# Patient Record
Sex: Female | Born: 1955
Health system: Southern US, Community
[De-identification: ages and names within clinical notes are randomized; demographics above are authoritative.]

## PROBLEM LIST (undated history)

## (undated) DIAGNOSIS — C4490 Unspecified malignant neoplasm of skin, unspecified: Secondary | ICD-10-CM

## (undated) DIAGNOSIS — G629 Polyneuropathy, unspecified: Secondary | ICD-10-CM

## (undated) DIAGNOSIS — G709 Myoneural disorder, unspecified: Secondary | ICD-10-CM

## (undated) DIAGNOSIS — J45909 Unspecified asthma, uncomplicated: Secondary | ICD-10-CM

## (undated) DIAGNOSIS — H269 Unspecified cataract: Secondary | ICD-10-CM

## (undated) DIAGNOSIS — E079 Disorder of thyroid, unspecified: Secondary | ICD-10-CM

## (undated) DIAGNOSIS — N951 Menopausal and female climacteric states: Secondary | ICD-10-CM

## (undated) DIAGNOSIS — M1990 Unspecified osteoarthritis, unspecified site: Secondary | ICD-10-CM

## (undated) DIAGNOSIS — E039 Hypothyroidism, unspecified: Secondary | ICD-10-CM

## (undated) DIAGNOSIS — G43909 Migraine, unspecified, not intractable, without status migrainosus: Secondary | ICD-10-CM

## (undated) DIAGNOSIS — T7840XA Allergy, unspecified, initial encounter: Secondary | ICD-10-CM

## (undated) HISTORY — DX: Unspecified cataract: H26.9

## (undated) HISTORY — DX: Migraine, unspecified, not intractable, without status migrainosus: G43.909

## (undated) HISTORY — DX: Unspecified asthma, uncomplicated: J45.909

## (undated) HISTORY — DX: Menopausal and female climacteric states: N95.1

## (undated) HISTORY — DX: Unspecified osteoarthritis, unspecified site: M19.90

## (undated) HISTORY — DX: Disorder of thyroid, unspecified: E07.9

## (undated) HISTORY — PX: COLON SURGERY: SHX602

## (undated) HISTORY — PX: APPENDECTOMY: SHX54

## (undated) HISTORY — DX: Polyneuropathy, unspecified: G62.9

## (undated) HISTORY — DX: Myoneural disorder, unspecified: G70.9

## (undated) HISTORY — DX: Allergy, unspecified, initial encounter: T78.40XA

---

## 1978-10-14 DIAGNOSIS — G43909 Migraine, unspecified, not intractable, without status migrainosus: Secondary | ICD-10-CM

## 1978-10-14 HISTORY — DX: Migraine, unspecified, not intractable, without status migrainosus: G43.909

## 2007-05-08 LAB — HM COLONOSCOPY: HM Colonoscopy: NORMAL

## 2011-02-13 ENCOUNTER — Ambulatory Visit: Payer: Self-pay | Admitting: Family Medicine

## 2012-04-20 ENCOUNTER — Ambulatory Visit: Payer: Self-pay | Admitting: Family Medicine

## 2013-02-03 ENCOUNTER — Ambulatory Visit (INDEPENDENT_AMBULATORY_CARE_PROVIDER_SITE_OTHER): Payer: 59 | Admitting: Internal Medicine

## 2013-02-03 ENCOUNTER — Encounter: Payer: Self-pay | Admitting: Internal Medicine

## 2013-02-03 VITALS — BP 108/72 | HR 67 | Temp 98.5°F | Resp 14 | Ht 65.0 in | Wt 137.5 lb

## 2013-02-03 DIAGNOSIS — G629 Polyneuropathy, unspecified: Secondary | ICD-10-CM

## 2013-02-03 DIAGNOSIS — E039 Hypothyroidism, unspecified: Secondary | ICD-10-CM

## 2013-02-03 DIAGNOSIS — G609 Hereditary and idiopathic neuropathy, unspecified: Secondary | ICD-10-CM

## 2013-02-03 DIAGNOSIS — N951 Menopausal and female climacteric states: Secondary | ICD-10-CM

## 2013-02-03 NOTE — Progress Notes (Signed)
Patient ID: Kristina Barnes, female   DOB: [birth year], 57 y.o.   MRN: [id number]     Patient Active Problem List  Diagnosis  . Menopause syndrome  . Sensory neuropathy    Subjective:  CC:   Chief Complaint  Patient presents with  . Establish Care    HPI:   Kristina Barnes is a 57 y.o. female who presents as a new patient to establish primary care with the chief complaint of  Establish care. 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 She has no chief complaints today. She has had recent labs at [facility] from October which she has brought today and are reviewed today normal.  She has a history of postmenopausal syndrome including hot flashes, mood lability, vaginal dryness and dyspareunia. She has been using ginkgo and recently began taking Lexapro which has helped significantly with all symptoms.  She has a history of a Sensory peirpheral neuropathy  which was diagnosed 7 yrs ago,   symptomsStarted in the feet,   and were accompanied by fasciculations.   There has beenNo progression. She has had MRI and  neurologic  eval with Marshell Garfinkel.  she has had a repeat MRI  to follow changes concerning for early MS and changes were deemed to be nonsignificant however she was advised to discontinue oral contraceptives due to recurrent migraines. She has no functional limitations as a result of her neuropathy. Her migraines at this point are very rare, her last one occurring 3 months ago. She describes in his visual migraines which are managed with aspirin. She does have prodrome symptoms which allow her to remove her stones that for many unsafe situations.     Past Medical History  Diagnosis Date  . Allergy   . Thyroid disease   . Migraine y-41  . Menopause syndrome   . Sensory neuropathy     Past Surgical History  Procedure Laterality Date  . Appendectomy      Family History   Problem Relation Age of Onset  . Arthritis Mother   . Hypertension Mother   . Cancer Father   . Depression Sister   . Pulmonary embolism Sister   . Alcohol abuse Sister   . Alcohol abuse Brother     History   Social History  . Marital Status: Married    Spouse Name: N/A    Number of Children: N/A  . Years of Education: N/A   Occupational History  . Not on file.   Social History Main Topics  . Smoking status: Never Smoker   . Smokeless tobacco: Never Used  . Alcohol Use: 3.5 oz/week    7 drink(s) per week     Comment: daily with dinner  . Drug Use: No  . Sexually Active: Yes    Birth Control/ Protection: Post-menopausal   Other Topics Concern  . Not on file   Social History Narrative  . No narrative on file    Allergies  Allergen Reactions  . Penicillins Rash  . Sulfa Antibiotics Rash   Allergies  Allergen Reactions  . Penicillins Rash  . Sulfa Antibiotics Rash     Review of Systems:  Patient denies headache, fevers, malaise, unintentional weight loss, skin rash, eye pain, sinus congestion and sinus pain, sore throat, dysphagia,  hemoptysis , cough, dyspnea, wheezing, chest pain, palpitations, orthopnea, edema, abdominal pain, nausea, melena, diarrhea, constipation, flank pain,  dysuria, hematuria, urinary  Frequency, nocturia, numbness, tingling, seizures,  Focal weakness, Loss of consciousness,  Tremor, insomnia, depression, anxiety, and suicidal ideation.    Objective:  BP 108/72  Pulse 67  Temp(Src) 98.5 F (36.9 C) (Oral)  Resp 14  Ht 5\' 5"  (1.651 m)  Wt 137 lb 8 oz (62.37 kg)  BMI 22.88 kg/m2  SpO2 98%  LMP 08/26/2012  General appearance: alert, cooperative and appears stated age Ears: normal TM's and external ear canals both ears Throat: lips, mucosa, and tongue normal; teeth and gums normal Neck: no adenopathy, no carotid bruit, supple, symmetrical, trachea midline and thyroid not enlarged, symmetric, no tenderness/mass/nodules Back:  symmetric, no curvature. ROM normal. No CVA tenderness. Lungs: clear to auscultation bilaterally Heart: regular rate and rhythm, S1, S2 normal, no murmur, click, rub or gallop Abdomen: soft, non-tender; bowel sounds normal; no masses,  no organomegaly Pulses: 2+ and symmetric Skin: Skin color, texture, turgor normal. No rashes or lesions Lymph nodes: Cervical, supraclavicular, and axillary nodes normal.  Assessment and Plan:  Sensory neuropathy Diagnosed 7 years ago with prior neurologic evaluation including serial MRIs showing a progression. No functional limitations.  Menopause syndrome All symptoms appear to be controlled with Lexapro and ginko.  Hypothyroidism and TSH was within normal patient prefers to continue current dose.   Updated Medication List Outpatient Encounter Prescriptions as of 02/03/2013  Medication Sig Dispense Refill  . aspirin 81 MG tablet Take 81 mg by mouth at bedtime.      Marland Kitchen escitalopram (LEXAPRO) 10 MG tablet Take 1 tablet (10 mg total) by mouth at bedtime.  90 tablet  3  . fish oil-omega-3 fatty acids 1000 MG capsule Take 1 g by mouth at bedtime.      . Ginkgo Biloba (GINKOBA PO) Take 60 mg by mouth at bedtime.      Marland Kitchen levothyroxine (SYNTHROID, LEVOTHROID) 100 MCG tablet Take 1 tablet (100 mcg total) by mouth at bedtime.  90 tablet  2  . loratadine (CLARITIN) 10 MG tablet Take 1 tablet (10 mg total) by mouth at bedtime.  90 tablet  3  . Lysine 500 MG TABS Take 1 tablet (500 mg total) by mouth at bedtime.  90 tablet  3  . [DISCONTINUED] escitalopram (LEXAPRO) 10 MG tablet Take 10 mg by mouth at bedtime.      . [DISCONTINUED] levothyroxine (SYNTHROID, LEVOTHROID) 100 MCG tablet Take 100 mcg by mouth at bedtime.      . [DISCONTINUED] loratadine (CLARITIN) 10 MG tablet Take 10 mg by mouth at bedtime.      . [DISCONTINUED] Lysine 500 MG TABS Take 500 mg by mouth at bedtime.       No facility-administered encounter medications on file as of 02/03/2013.

## 2013-02-05 ENCOUNTER — Encounter: Payer: Self-pay | Admitting: Internal Medicine

## 2013-02-05 DIAGNOSIS — N951 Menopausal and female climacteric states: Secondary | ICD-10-CM | POA: Insufficient documentation

## 2013-02-05 DIAGNOSIS — E039 Hypothyroidism, unspecified: Secondary | ICD-10-CM | POA: Insufficient documentation

## 2013-02-05 DIAGNOSIS — G629 Polyneuropathy, unspecified: Secondary | ICD-10-CM | POA: Insufficient documentation

## 2013-02-05 MED ORDER — LORATADINE 10 MG PO TABS: 10.0000 mg | ORAL_TABLET | Freq: Every day | ORAL | Status: AC

## 2013-02-05 MED ORDER — ESCITALOPRAM OXALATE 10 MG PO TABS: 10.0000 mg | ORAL_TABLET | Freq: Every day | ORAL | Status: DC

## 2013-02-05 MED ORDER — LEVOTHYROXINE SODIUM 100 MCG PO TABS: 100.0000 ug | ORAL_TABLET | Freq: Every day | ORAL | Status: DC

## 2013-02-05 MED ORDER — LYSINE 500 MG PO TABS: 500.0000 mg | ORAL_TABLET | Freq: Every day | ORAL | Status: DC

## 2013-02-05 NOTE — Assessment & Plan Note (Addendum)
All symptoms appear to be controlled with Lexapro and ginko.

## 2013-02-05 NOTE — Assessment & Plan Note (Signed)
and TSH was within normal patient prefers to continue current dose.

## 2013-02-05 NOTE — Assessment & Plan Note (Signed)
Diagnosed 7 years ago with prior neurologic evaluation including serial MRIs showing a progression. No functional limitations.

## 2013-03-25 ENCOUNTER — Encounter: Payer: Self-pay | Admitting: Internal Medicine

## 2013-04-29 ENCOUNTER — Encounter: Payer: Self-pay | Admitting: Internal Medicine

## 2013-05-18 ENCOUNTER — Ambulatory Visit: Payer: Self-pay | Admitting: Internal Medicine

## 2013-05-20 ENCOUNTER — Telehealth: Payer: Self-pay | Admitting: Internal Medicine

## 2013-05-31 ENCOUNTER — Encounter: Payer: Self-pay | Admitting: Internal Medicine

## 2013-09-13 DIAGNOSIS — Z85828 Personal history of other malignant neoplasm of skin: Secondary | ICD-10-CM | POA: Insufficient documentation

## 2013-10-14 DIAGNOSIS — C4490 Unspecified malignant neoplasm of skin, unspecified: Secondary | ICD-10-CM

## 2013-10-14 DIAGNOSIS — C4491 Basal cell carcinoma of skin, unspecified: Secondary | ICD-10-CM

## 2013-10-14 HISTORY — DX: Unspecified malignant neoplasm of skin, unspecified: C44.90

## 2013-10-14 HISTORY — DX: Basal cell carcinoma of skin, unspecified: C44.91

## 2013-11-26 ENCOUNTER — Other Ambulatory Visit: Payer: Self-pay | Admitting: Internal Medicine

## 2013-11-26 DIAGNOSIS — R5381 Other malaise: Secondary | ICD-10-CM

## 2013-11-26 DIAGNOSIS — R5383 Other fatigue: Secondary | ICD-10-CM

## 2013-11-26 DIAGNOSIS — E039 Hypothyroidism, unspecified: Secondary | ICD-10-CM

## 2013-11-26 DIAGNOSIS — Z13220 Encounter for screening for lipoid disorders: Secondary | ICD-10-CM

## 2013-11-26 DIAGNOSIS — E559 Vitamin D deficiency, unspecified: Secondary | ICD-10-CM

## 2013-11-26 NOTE — Telephone Encounter (Signed)
Last TSH 10/13

## 2013-11-28 NOTE — Telephone Encounter (Signed)
Please  tell Dr Oran Rein that she needs to have her TSH checked.  30 days supply approved.

## 2013-12-01 ENCOUNTER — Telehealth: Payer: Self-pay | Admitting: Internal Medicine

## 2013-12-01 NOTE — Telephone Encounter (Signed)
Pt needs refill on thyroid medication, levothyroxine 100 mcg.  [facility] pharmacy

## 2013-12-02 MED ORDER — LEVOTHYROXINE SODIUM 100 MCG PO TABS: ORAL_TABLET | ORAL | Status: DC

## 2013-12-02 NOTE — Telephone Encounter (Signed)
Refill one 30 days only.  Has not been seen in one year so she needs a  30 minute visit.  

## 2013-12-02 NOTE — Telephone Encounter (Signed)
Patient has not been sen since 4/14 okay to fill? Has appointment scheduled for 3/15

## 2013-12-07 NOTE — Telephone Encounter (Signed)
Left detailed  Message per dpr.

## 2013-12-24 ENCOUNTER — Encounter: Payer: 59 | Admitting: Internal Medicine

## 2013-12-31 ENCOUNTER — Other Ambulatory Visit (HOSPITAL_COMMUNITY)
Admission: RE | Admit: 2013-12-31 | Discharge: 2013-12-31 | Disposition: A | Discharge status: Home / Self Care | Payer: 59 | Source: Ambulatory Visit | Attending: Internal Medicine | Admitting: Internal Medicine

## 2013-12-31 ENCOUNTER — Ambulatory Visit (INDEPENDENT_AMBULATORY_CARE_PROVIDER_SITE_OTHER): Payer: 59 | Admitting: Internal Medicine

## 2013-12-31 ENCOUNTER — Encounter: Payer: Self-pay | Admitting: Internal Medicine

## 2013-12-31 VITALS — BP 108/60 | HR 67 | Temp 97.8°F | Resp 16 | Ht 65.75 in | Wt 135.5 lb

## 2013-12-31 DIAGNOSIS — E559 Vitamin D deficiency, unspecified: Secondary | ICD-10-CM

## 2013-12-31 DIAGNOSIS — Z1151 Encounter for screening for human papillomavirus (HPV): Secondary | ICD-10-CM | POA: Insufficient documentation

## 2013-12-31 DIAGNOSIS — R5383 Other fatigue: Secondary | ICD-10-CM

## 2013-12-31 DIAGNOSIS — Z13220 Encounter for screening for lipoid disorders: Secondary | ICD-10-CM

## 2013-12-31 DIAGNOSIS — Z124 Encounter for screening for malignant neoplasm of cervix: Secondary | ICD-10-CM | POA: Insufficient documentation

## 2013-12-31 DIAGNOSIS — Z0000 Encounter for general adult medical examination without abnormal findings: Secondary | ICD-10-CM

## 2013-12-31 DIAGNOSIS — E039 Hypothyroidism, unspecified: Secondary | ICD-10-CM

## 2013-12-31 DIAGNOSIS — R5381 Other malaise: Secondary | ICD-10-CM

## 2013-12-31 LAB — COMPREHENSIVE METABOLIC PANEL
ALK PHOS: 58 U/L (ref 39–117)
ALT: 16 U/L (ref 0–35)
AST: 20 U/L (ref 0–37)
Albumin: 4.7 g/dL (ref 3.5–5.2)
BUN: 17 mg/dL (ref 6–23)
CALCIUM: 9.5 mg/dL (ref 8.4–10.5)
CHLORIDE: 101 meq/L (ref 96–112)
CO2: 31 mEq/L (ref 19–32)
Creatinine, Ser: 0.6 mg/dL (ref 0.4–1.2)
GFR: 103.57 mL/min (ref >60.00)
Glucose, Bld: 77 mg/dL (ref 70–99)
POTASSIUM: 3.8 meq/L (ref 3.5–5.1)
Sodium: 139 mEq/L (ref 135–145)
Total Bilirubin: 0.7 mg/dL (ref 0.3–1.2)
Total Protein: 7.3 g/dL (ref 6.0–8.3)

## 2013-12-31 LAB — CBC WITH DIFFERENTIAL/PLATELET
BASOS PCT: 0.4 % (ref 0.0–3.0)
Basophils Absolute: 0 10*3/uL (ref 0.0–0.1)
EOS ABS: 0.1 10*3/uL (ref 0.0–0.7)
Eosinophils Relative: 1.4 % (ref 0.0–5.0)
HCT: 39.5 % (ref 36.0–46.0)
Hemoglobin: 13.1 g/dL (ref 12.0–15.0)
LYMPHS PCT: 34 % (ref 12.0–46.0)
Lymphs Abs: 2 10*3/uL (ref 0.7–4.0)
MCHC: 33.1 g/dL (ref 30.0–36.0)
MCV: 96.5 fl (ref 78.0–100.0)
Monocytes Absolute: 0.6 10*3/uL (ref 0.1–1.0)
Monocytes Relative: 9.7 % (ref 3.0–12.0)
Neutro Abs: 3.2 10*3/uL (ref 1.4–7.7)
Neutrophils Relative %: 54.5 % (ref 43.0–77.0)
PLATELETS: 256 10*3/uL (ref 150.0–400.0)
RBC: 4.09 Mil/uL (ref 3.87–5.11)
RDW: 12.9 % (ref 11.5–14.6)
WBC: 5.8 10*3/uL (ref 4.5–10.5)

## 2013-12-31 LAB — LIPID PANEL
Cholesterol: 189 mg/dL (ref 0–200)
HDL: 99.1 mg/dL (ref >39.00)
LDL CALC: 82 mg/dL (ref 0–99)
Total CHOL/HDL Ratio: 2
Triglycerides: 40 mg/dL (ref 0.0–149.0)
VLDL: 8 mg/dL (ref 0.0–40.0)

## 2013-12-31 LAB — TSH: TSH: 0.02 u[IU]/mL — ABNORMAL LOW (ref 0.35–5.50)

## 2013-12-31 MED ORDER — LEVOTHYROXINE SODIUM 100 MCG PO TABS: ORAL_TABLET | ORAL | Status: DC

## 2013-12-31 NOTE — Progress Notes (Signed)
Patient ID: Kristina Barnes, female   DOB: 12/20/1955, 58 y.o.   MRN: [id number] ,.   Subjective:     Kristina Barnes is a 58 y.o. female and is here for a comprehensive physical exam. The patient reports no problems.  History   Social History  . Marital Status: Married    Spouse Name: N/A    Number of Children: N/A  . Years of Education: N/A   Occupational History  . Not on file.   Social History Main Topics  . Smoking status: Never Smoker   . Smokeless tobacco: Never Used  . Alcohol Use: 3.5 oz/week    7 drink(s) per week     Comment: daily with dinner  . Drug Use: No  . Sexual Activity: Yes    Birth Control/ Protection: Post-menopausal   Other Topics Concern  . Not on file   Social History Narrative  . No narrative on file   Health Maintenance  Topic Date Due  . Influenza Vaccine  05/14/2014  . Mammogram  05/19/2015  . Pap Smear  12/31/2016  . Tetanus/tdap  02/05/2017  . Colonoscopy  05/07/2017    The following portions of the patient's history were reviewed and updated as appropriate: allergies, current medications, past family history, past medical history, past social history, past surgical history and problem list.  Review of Systems A comprehensive review of systems was negative.   Objective:   General Appearance:    Alert, cooperative, no distress, appears stated age  Head:    Normocephalic, without obvious abnormality, atraumatic  Eyes:    PERRL, conjunctiva/corneas clear, EOM's intact, fundi    benign, both eyes  Ears:    Normal TM's and external ear canals, both ears  Nose:   Nares normal, septum midline, mucosa normal, no drainage    or sinus tenderness  Throat:   Lips, mucosa, and tongue normal; teeth and gums normal  Neck:   Supple, symmetrical, trachea midline, no adenopathy;    thyroid:  no enlargement/tenderness/nodules; no carotid   bruit or JVD  Back:     Symmetric, no curvature, ROM normal, no CVA tenderness  Lungs:     Clear to  auscultation bilaterally, respirations unlabored  Chest Wall:    No tenderness or deformity   Heart:    Regular rate and rhythm, S1 and S2 normal, no murmur, rub   or gallop  Breast Exam:    No tenderness, masses, or nipple abnormality  Abdomen:     Soft, non-tender, bowel sounds active all four quadrants,    no masses, no organomegaly  Genitalia:    Pelvic: cervix normal in appearance, external genitalia normal, no adnexal masses or tenderness, no cervical motion tenderness, rectovaginal septum normal, uterus normal size, shape, and consistency and vagina normal without discharge  Extremities:   Extremities normal, atraumatic, no cyanosis or edema  Pulses:   2+ and symmetric all extremities  Skin:   Skin color, texture, turgor normal, no rashes or lesions  Lymph nodes:   Cervical, supraclavicular, and axillary nodes normal  Neurologic:   CNII-XII intact, normal strength, sensation and reflexes    throughout     Assessment and Plan:    Routine general medical examination at a health care facility Annual comprehensive exam was done including breast, pelvic and PAP smear. All screenings have been addressed .   Hypothyroidism Thyroid function is overactive on current dose.  I have sent a lower dose of levothyroxine to her pharmacy and would like patient  to change immediately   Lab Results  Component Value Date   TSH 0.02* 12/31/2013    Updated Medication List Outpatient Encounter Prescriptions as of 12/31/2013  Medication Sig  . aspirin 81 MG tablet Take 81 mg by mouth at bedtime.  Marland Kitchen escitalopram (LEXAPRO) 10 MG tablet Take 1 tablet (10 mg total) by mouth at bedtime.  . fish oil-omega-3 fatty acids 1000 MG capsule Take 1 g by mouth at bedtime.  Marland Kitchen levothyroxine (SYNTHROID, LEVOTHROID) 88 MCG tablet Take 1 tablet (88 mcg total) by mouth daily before breakfast. TAKE ONE TABLET BY MOUTH AT BEDTIME  . loratadine (CLARITIN) 10 MG tablet Take 1 tablet (10 mg total) by mouth at bedtime.  Marland Kitchen  Lysine 500 MG TABS Take 1 tablet (500 mg total) by mouth at bedtime.  . [DISCONTINUED] levothyroxine (SYNTHROID, LEVOTHROID) 100 MCG tablet TAKE ONE TABLET BY MOUTH AT BEDTIME  . [DISCONTINUED] levothyroxine (SYNTHROID, LEVOTHROID) 100 MCG tablet TAKE ONE TABLET BY MOUTH AT BEDTIME  . Ginkgo Biloba (GINKOBA PO) Take 60 mg by mouth at bedtime.

## 2013-12-31 NOTE — Patient Instructions (Signed)
You had your annual  wellness exam today  We will schedule your mammogram when it is due.   We will contact you with the PAP smear and bloodwork results and refill your levothyroxine once I review your TSH

## 2014-01-01 LAB — VITAMIN D 25 HYDROXY (VIT D DEFICIENCY, FRACTURES): Vit D, 25-Hydroxy: 27 ng/mL — ABNORMAL LOW (ref 30–89)

## 2014-01-02 DIAGNOSIS — Z0000 Encounter for general adult medical examination without abnormal findings: Secondary | ICD-10-CM | POA: Insufficient documentation

## 2014-01-02 DIAGNOSIS — E559 Vitamin D deficiency, unspecified: Secondary | ICD-10-CM | POA: Insufficient documentation

## 2014-01-02 MED ORDER — ERGOCALCIFEROL 1.25 MG (50000 UT) PO CAPS: 50000.0000 [IU] | ORAL_CAPSULE | ORAL | Status: DC

## 2014-01-02 MED ORDER — LEVOTHYROXINE SODIUM 88 MCG PO TABS: 88.0000 ug | ORAL_TABLET | Freq: Every day | ORAL | Status: DC

## 2014-01-02 NOTE — Addendum Note (Signed)
Addended by: Crecencio Mc on: 01/02/2014 10:58 PM   Modules accepted: Orders

## 2014-01-02 NOTE — Assessment & Plan Note (Signed)
Thyroid function is overactive on current dose.  I have sent a lower dose of levothyroxine to her pharmacy and would like patient to change immediately   Lab Results  Component Value Date   TSH 0.02* 12/31/2013

## 2014-01-02 NOTE — Assessment & Plan Note (Signed)
Annual comprehensive exam was done including breast, pelvic and PAP smear. All screenings have been addressed .  

## 2014-01-04 ENCOUNTER — Encounter: Payer: Self-pay | Admitting: *Deleted

## 2014-01-05 ENCOUNTER — Encounter: Payer: Self-pay | Admitting: Internal Medicine

## 2014-03-03 ENCOUNTER — Other Ambulatory Visit (INDEPENDENT_AMBULATORY_CARE_PROVIDER_SITE_OTHER): Payer: 59

## 2014-03-03 DIAGNOSIS — E559 Vitamin D deficiency, unspecified: Secondary | ICD-10-CM

## 2014-03-03 DIAGNOSIS — R5381 Other malaise: Secondary | ICD-10-CM

## 2014-03-03 DIAGNOSIS — R5383 Other fatigue: Secondary | ICD-10-CM

## 2014-03-03 DIAGNOSIS — E039 Hypothyroidism, unspecified: Secondary | ICD-10-CM

## 2014-03-03 LAB — COMPREHENSIVE METABOLIC PANEL
ALT: 15 U/L (ref 0–35)
AST: 20 U/L (ref 0–37)
Albumin: 4.2 g/dL (ref 3.5–5.2)
Alkaline Phosphatase: 53 U/L (ref 39–117)
BILIRUBIN TOTAL: 0.4 mg/dL (ref 0.2–1.2)
BUN: 17 mg/dL (ref 6–23)
CALCIUM: 9.3 mg/dL (ref 8.4–10.5)
CHLORIDE: 105 meq/L (ref 96–112)
CO2: 29 meq/L (ref 19–32)
CREATININE: 0.6 mg/dL (ref 0.4–1.2)
GFR: 103.51 mL/min (ref >60.00)
Glucose, Bld: 74 mg/dL (ref 70–99)
Potassium: 4.3 mEq/L (ref 3.5–5.1)
Sodium: 141 mEq/L (ref 135–145)
Total Protein: 6.9 g/dL (ref 6.0–8.3)

## 2014-03-03 LAB — TSH: TSH: 0.26 u[IU]/mL — AB (ref 0.35–4.50)

## 2014-03-04 ENCOUNTER — Encounter: Payer: Self-pay | Admitting: Internal Medicine

## 2014-03-04 LAB — VITAMIN D 25 HYDROXY (VIT D DEFICIENCY, FRACTURES): Vit D, 25-Hydroxy: 53.8 ng/mL (ref 30.0–100.0)

## 2014-03-04 MED ORDER — LEVOTHYROXINE SODIUM 75 MCG PO TABS: 75.0000 ug | ORAL_TABLET | Freq: Every day | ORAL | Status: DC

## 2014-03-04 NOTE — Assessment & Plan Note (Signed)
Thyroid function is still overactive on current dose.  I have sent a lower dose of levothyroxine to her pharmacy.

## 2014-04-22 ENCOUNTER — Emergency Department: Payer: Self-pay | Admitting: Emergency Medicine

## 2014-05-01 ENCOUNTER — Encounter: Payer: Self-pay | Admitting: Internal Medicine

## 2014-05-02 MED ORDER — ESCITALOPRAM OXALATE 10 MG PO TABS: 10.0000 mg | ORAL_TABLET | Freq: Every day | ORAL | Status: DC

## 2014-05-02 NOTE — Telephone Encounter (Signed)
Refill completed.

## 2014-07-11 LAB — TSH: TSH: 0.79 u[IU]/mL (ref 0.41–5.90)

## 2014-07-13 ENCOUNTER — Telehealth: Payer: Self-pay | Admitting: Internal Medicine

## 2014-07-13 DIAGNOSIS — E034 Atrophy of thyroid (acquired): Secondary | ICD-10-CM

## 2014-07-13 MED ORDER — LEVOTHYROXINE SODIUM 75 MCG PO TABS: 75.0000 ug | ORAL_TABLET | Freq: Every day | ORAL | Status: DC

## 2014-08-26 ENCOUNTER — Ambulatory Visit: Payer: Self-pay | Admitting: Internal Medicine

## 2014-08-26 ENCOUNTER — Encounter: Payer: Self-pay | Admitting: *Deleted

## 2014-08-26 LAB — HM MAMMOGRAPHY: HM MAMMO: NEGATIVE

## 2014-12-05 ENCOUNTER — Ambulatory Visit: Payer: 59 | Admitting: Internal Medicine

## 2014-12-15 ENCOUNTER — Encounter: Payer: Self-pay | Admitting: Internal Medicine

## 2014-12-15 ENCOUNTER — Ambulatory Visit (INDEPENDENT_AMBULATORY_CARE_PROVIDER_SITE_OTHER): Payer: 59 | Admitting: Internal Medicine

## 2014-12-15 VITALS — BP 98/68 | HR 64 | Temp 98.4°F | Resp 14 | Ht 66.0 in | Wt 146.8 lb

## 2014-12-15 DIAGNOSIS — E559 Vitamin D deficiency, unspecified: Secondary | ICD-10-CM

## 2014-12-15 DIAGNOSIS — M778 Other enthesopathies, not elsewhere classified: Secondary | ICD-10-CM | POA: Insufficient documentation

## 2014-12-15 DIAGNOSIS — E034 Atrophy of thyroid (acquired): Secondary | ICD-10-CM

## 2014-12-15 DIAGNOSIS — S82091S Other fracture of right patella, sequela: Secondary | ICD-10-CM

## 2014-12-15 DIAGNOSIS — F320 Major depressive disorder, single episode, mild: Secondary | ICD-10-CM

## 2014-12-15 DIAGNOSIS — S82091A Other fracture of right patella, initial encounter for closed fracture: Secondary | ICD-10-CM | POA: Insufficient documentation

## 2014-12-15 DIAGNOSIS — E038 Other specified hypothyroidism: Secondary | ICD-10-CM

## 2014-12-15 LAB — TSH: TSH: 0.63 u[IU]/mL (ref 0.35–4.50)

## 2014-12-15 LAB — VITAMIN D 25 HYDROXY (VIT D DEFICIENCY, FRACTURES): VITD: 39.95 ng/mL (ref 30.00–100.00)

## 2014-12-15 MED ORDER — BUPROPION HCL ER (XL) 150 MG PO TB24: 150.0000 mg | ORAL_TABLET | Freq: Every day | ORAL | Status: DC

## 2014-12-15 NOTE — Progress Notes (Signed)
Pre-visit discussion using our clinic review tool. No additional management support is needed unless otherwise documented below in the visit note.  

## 2014-12-15 NOTE — Progress Notes (Signed)
Patient ID: Kristina Barnes, female   DOB: 06-11-1956, 59 y.o.   MRN: [id number]   Patient Active Problem List   Diagnosis Date Noted  . Depression, major, single episode, mild 12/18/2014  . Wrist capsulitis 12/15/2014  . Patellar sleeve fracture of right knee 12/15/2014  . Routine general medical examination at a health care facility 01/02/2014  . Vitamin D deficiency 01/02/2014  . Hypothyroidism 02/05/2013  . Menopause syndrome   . Sensory neuropathy     Subjective:  CC:   Chief Complaint  Patient presents with  . Follow-up    Thyroid and Vit D    HPI:   Kristina Barnes is a 59 y.o. female who presents for  Follow up on hypothyroidism and vitamin d deficiency.  Since last visit patient has had a fall at work which resusted in a patellar sleeve fracture of the right knee and a fracture of the right wrist complicated by capsulitis.     She became tearful today after some discussion of insomnia and mood changes.  15 minute discussion of work and relationship issues which have been contributing to her mood changes.    Lab Results  Component Value Date   TSH 0.79 07/11/2014     Past Medical History  Diagnosis Date  . Allergy   . Thyroid disease   . Migraine y-41  . Menopause syndrome   . Sensory neuropathy     Past Surgical History  Procedure Laterality Date  . Appendectomy         The following portions of the patient's history were reviewed and updated as appropriate: Allergies, current medications, and problem list.    Review of Systems:   Patient denies headache, fevers, malaise, unintentional weight loss, skin rash, eye pain, sinus congestion and sinus pain, sore throat, dysphagia,  hemoptysis , cough, dyspnea, wheezing, chest pain, palpitations, orthopnea, edema, abdominal pain, nausea, melena, diarrhea, constipation, flank pain, dysuria, hematuria, urinary  Frequency, nocturia, numbness, tingling, seizures,  Focal weakness, Loss of consciousness,   Tremor, insomnia, depression, anxiety, and suicidal ideation.     History   Social History  . Marital Status: Married    Spouse Name: N/A  . Number of Children: N/A  . Years of Education: N/A   Occupational History  . Not on file.   Social History Main Topics  . Smoking status: Never Smoker   . Smokeless tobacco: Never Used  . Alcohol Use: 3.5 oz/week    7 drink(s) per week     Comment: daily with dinner  . Drug Use: No  . Sexual Activity: Yes    Birth Control/ Protection: Post-menopausal   Other Topics Concern  . Not on file   Social History Narrative    Objective:  Filed Vitals:   12/15/14 1024  BP: 98/68  Pulse: 64  Temp: 98.4 F (36.9 C)  Resp: 14     General appearance: alert, cooperative and appears stated age Ears: normal TM's and external ear canals both ears Throat: lips, mucosa, and tongue normal; teeth and gums normal Neck: no adenopathy, no carotid bruit, supple, symmetrical, trachea midline and thyroid not enlarged, symmetric, no tenderness/mass/nodules Back: symmetric, no curvature. ROM normal. No CVA tenderness. Lungs: clear to auscultation bilaterally Heart: regular rate and rhythm, S1, S2 normal, no murmur, click, rub or gallop Abdomen: soft, non-tender; bowel sounds normal; no masses,  no organomegaly Pulses: 2+ and symmetric Skin: Skin color, texture, turgor normal. No rashes or lesions Lymph nodes: Cervical, supraclavicular, and axillary  nodes normal. Psych: affect normal, makes good eye contact. No fidgeting,  Smiles easily.  Tearful with discussion Denies suicidal thoughts  Assessment and Plan:  Depression, major, single episode, mild Complicated by loss of libido.  Trial of wellbutrin discussed offered and accepted.    Hypothyroidism Thyroid function is WNL on current dose.  No current changes needed.   Lab Results  Component Value Date   TSH 0.63 12/15/2014      Wrist capsulitis Symptoms are improving with voltaren gel and  brace.    Vitamin D deficiency Repeat level today is normal.  Continue current dose.  A total of 25 minutes of face to face time was spent with patient more than half of which was spent in counselling on the above mentioned issues.   Updated Medication List Outpatient Encounter Prescriptions as of 12/15/2014  Medication Sig  . Cholecalciferol (VITAMIN D3) 1000 UNITS CAPS Take 1 capsule by mouth daily.  . diclofenac sodium (VOLTAREN) 1 % GEL Apply 2 g topically 4 (four) times daily as needed.  . fish oil-omega-3 fatty acids 1000 MG capsule Take 1 g by mouth at bedtime.  . Ginkgo Biloba (GINKOBA PO) Take 60 mg by mouth at bedtime.  Marland Kitchen levothyroxine (SYNTHROID, LEVOTHROID) 75 MCG tablet Take 1 tablet (75 mcg total) by mouth daily before breakfast. TAKE ONE TABLET BY MOUTH AT BEDTIME  . loratadine (CLARITIN) 10 MG tablet Take 1 tablet (10 mg total) by mouth at bedtime.  Marland Kitchen Lysine 500 MG TABS Take 1 tablet (500 mg total) by mouth at bedtime.  Marland Kitchen buPROPion (WELLBUTRIN XL) 150 MG 24 hr tablet Take 1 tablet (150 mg total) by mouth daily.  . [DISCONTINUED] aspirin 81 MG tablet Take 81 mg by mouth at bedtime.  . [DISCONTINUED] ergocalciferol (DRISDOL) 50000 UNITS capsule Take 1 capsule (50,000 Units total) by mouth once a week.  . [DISCONTINUED] escitalopram (LEXAPRO) 10 MG tablet Take 1 tablet (10 mg total) by mouth at bedtime.     Orders Placed This Encounter  Procedures  . TSH  . Vit D  25 hydroxy (rtn osteoporosis monitoring)    No Follow-up on file.      Marland Kitchen

## 2014-12-16 ENCOUNTER — Encounter: Payer: Self-pay | Admitting: Internal Medicine

## 2014-12-18 DIAGNOSIS — F320 Major depressive disorder, single episode, mild: Secondary | ICD-10-CM | POA: Insufficient documentation

## 2014-12-18 NOTE — Assessment & Plan Note (Signed)
Repeat level today is normal.  Continue current dose.

## 2014-12-18 NOTE — Assessment & Plan Note (Signed)
Thyroid function is WNL on current dose.  No current changes needed.   Lab Results  Component Value Date   TSH 0.63 12/15/2014

## 2014-12-18 NOTE — Assessment & Plan Note (Signed)
Complicated by loss of libido.  Trial of wellbutrin discussed offered and accepted.

## 2014-12-18 NOTE — Assessment & Plan Note (Signed)
Symptoms are improving with voltaren gel and brace.

## 2015-02-24 ENCOUNTER — Other Ambulatory Visit: Payer: Self-pay | Admitting: Orthopedic Surgery

## 2015-02-24 DIAGNOSIS — M25561 Pain in right knee: Secondary | ICD-10-CM

## 2015-03-04 ENCOUNTER — Ambulatory Visit
Admission: RE | Admit: 2015-03-04 | Discharge: 2015-03-04 | Disposition: A | Discharge status: Home / Self Care | Payer: PRIVATE HEALTH INSURANCE | Source: Ambulatory Visit | Attending: Orthopedic Surgery | Admitting: Orthopedic Surgery

## 2015-03-04 DIAGNOSIS — M67863 Other specified disorders of tendon, right knee: Secondary | ICD-10-CM | POA: Diagnosis not present

## 2015-03-04 DIAGNOSIS — S82031K Displaced transverse fracture of right patella, subsequent encounter for closed fracture with nonunion: Secondary | ICD-10-CM | POA: Insufficient documentation

## 2015-03-04 DIAGNOSIS — M25561 Pain in right knee: Secondary | ICD-10-CM

## 2015-03-04 DIAGNOSIS — M898X5 Other specified disorders of bone, thigh: Secondary | ICD-10-CM | POA: Diagnosis not present

## 2015-03-04 DIAGNOSIS — M2391 Unspecified internal derangement of right knee: Secondary | ICD-10-CM | POA: Insufficient documentation

## 2015-03-04 DIAGNOSIS — M25461 Effusion, right knee: Secondary | ICD-10-CM | POA: Insufficient documentation

## 2015-04-13 ENCOUNTER — Other Ambulatory Visit: Payer: Self-pay | Admitting: Internal Medicine

## 2015-05-08 ENCOUNTER — Ambulatory Visit (INDEPENDENT_AMBULATORY_CARE_PROVIDER_SITE_OTHER): Payer: 59 | Admitting: Internal Medicine

## 2015-05-08 ENCOUNTER — Encounter: Payer: Self-pay | Admitting: Internal Medicine

## 2015-05-08 VITALS — BP 108/70 | HR 67 | Temp 98.1°F | Resp 12 | Ht 66.0 in | Wt 147.5 lb

## 2015-05-08 DIAGNOSIS — F320 Major depressive disorder, single episode, mild: Secondary | ICD-10-CM

## 2015-05-08 MED ORDER — BUPROPION HCL ER (XL) 300 MG PO TB24: 300.0000 mg | ORAL_TABLET | Freq: Every day | ORAL | Status: DC

## 2015-05-08 NOTE — Progress Notes (Signed)
Subjective:  Patient ID: Kristina Barnes, female    DOB: May 28, 1956  Age: 59 y.o. MRN: [id number]  CC: The encounter diagnosis was Depression, major, single episode, mild.  HPI Kristina Barnes presents for management of depression.  She has been taking wellbutrin, but when  she self scored a 15 on PHQ9  she increased  Her  Dose to 300 mg last week.  [place]ok has been stressful due to lack of support and staff incompetence.  She is tearful today.    Outpatient Prescriptions Prior to Visit  Medication Sig Dispense Refill  . Cholecalciferol (VITAMIN D3) 1000 UNITS CAPS Take 1 capsule by mouth daily.    . diclofenac sodium (VOLTAREN) 1 % GEL Apply 2 g topically 4 (four) times daily as needed.    . fish oil-omega-3 fatty acids 1000 MG capsule Take 1 g by mouth at bedtime.    . Ginkgo Biloba (GINKOBA PO) Take 60 mg by mouth at bedtime.    Marland Kitchen levothyroxine (SYNTHROID, LEVOTHROID) 75 MCG tablet Take 1 tablet (75 mcg total) by mouth daily before breakfast. TAKE ONE TABLET BY MOUTH AT BEDTIME 90 tablet 1  . levothyroxine (SYNTHROID, LEVOTHROID) 75 MCG tablet TAKE 1 TABLET BY MOUTH DAILY BEFORE BREAKFAST 90 tablet 2  . loratadine (CLARITIN) 10 MG tablet Take 1 tablet (10 mg total) by mouth at bedtime. 90 tablet 3  . Lysine 500 MG TABS Take 1 tablet (500 mg total) by mouth at bedtime. 90 tablet 3  . buPROPion (WELLBUTRIN XL) 150 MG 24 hr tablet Take 1 tablet (150 mg total) by mouth daily. 90 tablet 1   No facility-administered medications prior to visit.    Review of Systems;  Patient denies headache, fevers, malaise, unintentional weight loss, skin rash, eye pain, sinus congestion and sinus pain, sore throat, dysphagia,  hemoptysis , cough, dyspnea, wheezing, chest pain, palpitations, orthopnea, edema, abdominal pain, nausea, melena, diarrhea, constipation, flank pain, dysuria, hematuria, urinary  Frequency, nocturia, numbness, tingling, seizures,  Focal weakness, Loss of consciousness,  Tremor,  insomnia, depression, anxiety, and suicidal ideation.      Objective:  BP 108/70 mmHg  Pulse 67  Temp(Src) 98.1 F (36.7 C) (Oral)  Resp 12  Ht 5\' 6"  (1.676 m)  Wt 147 lb 8 oz (66.906 kg)  BMI 23.82 kg/m2  SpO2 99%  BP Readings from Last 3 Encounters:  05/08/15 108/70  12/15/14 98/68  12/31/13 108/60    Wt Readings from Last 3 Encounters:  05/08/15 147 lb 8 oz (66.906 kg)  12/15/14 146 lb 12 oz (66.565 kg)  12/31/13 135 lb 8 oz (61.462 kg)    General appearance: alert, cooperative and appears stated age Ears: normal TM's and external ear canals both ears Throat: lips, mucosa, and tongue normal; teeth and gums normal Neck: no adenopathy, no carotid bruit, supple, symmetrical, trachea midline and thyroid not enlarged, symmetric, no tenderness/mass/nodules Back: symmetric, no curvature. ROM normal. No CVA tenderness. Lungs: clear to auscultation bilaterally Heart: regular rate and rhythm, S1, S2 normal, no murmur, click, rub or gallop Abdomen: soft, non-tender; bowel sounds normal; no masses,  no organomegaly Pulses: 2+ and symmetric Skin: Skin color, texture, turgor normal. No rashes or lesions Lymph nodes: Cervical, supraclavicular, and axillary nodes normal.  No results found for: HGBA1C  Lab Results  Component Value Date   CREATININE 0.6 03/03/2014   CREATININE 0.6 12/31/2013    Lab Results  Component Value Date   WBC 5.8 12/31/2013   HGB 13.1 12/31/2013  HCT 39.5 12/31/2013   PLT 256.0 12/31/2013   GLUCOSE 74 03/03/2014   CHOL 189 12/31/2013   TRIG 40.0 12/31/2013   HDL 99.10 12/31/2013   LDLCALC 82 12/31/2013   ALT 15 03/03/2014   AST 20 03/03/2014   NA 141 03/03/2014   K 4.3 03/03/2014   CL 105 03/03/2014   CREATININE 0.6 03/03/2014   BUN 17 03/03/2014   CO2 29 03/03/2014   TSH 0.63 12/15/2014    Mr Knee Right Wo Contrast  03/04/2015   CLINICAL DATA:  Fall 1 year ago with patellar fracture. Right knee pain and swelling anteriorly with popping  and clicking.  EXAM: MRI OF THE RIGHT KNEE WITHOUT CONTRAST  TECHNIQUE: Multiplanar, multisequence MR imaging of the knee was performed. No intravenous contrast was administered.  COMPARISON:  04/22/2014  FINDINGS: MENISCI  Medial meniscus: Mildly complex tear of the posterior horn, with a vertical component involving the superior and inferior meniscal surfaces, and oblique component extending to the periphery and inferior surface as shown on image 22 series 4. No current bucket-handle.  Lateral meniscus:  Unremarkable  LIGAMENTS  Cruciates:  Unremarkable  Collaterals:  Proximal popliteus tendinopathy.  CARTILAGE  Patellofemoral: Considerable chondral thinning along the partially fused inferior patellar fracture fragment. Focal chondral defect with underlying focal osseous edema along the upper posterior patellar ridge. Femoral trochlear articular cartilage within normal limits.  Medial: Small focal osteochondral lesion posteriorly in the medial femoral condyle, images 8 through 10 of series 6, without fragmentation. Mild degenerative chondral thinning.  Lateral:  Unremarkable  Joint:  Trace joint effusion.  Popliteal Fossa:  Unremarkable  Extensor Mechanism: Thickening of the proximal patellar tendon, likely chronic.  Bones: The transverse patellar fracture is partially bridged but has some nonunited components.  IMPRESSION: 1. Partial bony bridging of the prior transverse patellar fracture, with some nonunited components. The dominant inferior fracture fragment component demonstrates considerable articular cartilage thinning. There is also a focal chondral defect with underlying focal osseous edema along the upper posterior patellar ridge. 2. There is a small focal osteochondral lesion posteriorly in the medial femoral condyle, with loss of overlying cartilage but without a loose bony fragment identified. 3. Mild degenerative chondral thinning in the medial compartment. 4. Proximal popliteus tendinopathy. 5.  Thickening compatible with tendinopathy in the proximal patellar tendon. 6. Trace joint effusion.   Electronically Signed   By: Van Clines M.D.   On: 03/04/2015 15:09    Assessment & Plan:   Problem List Items Addressed This Visit      Unprioritized   Depression, major, single episode, mild - Primary    Recurrent,  Improving with increased dose of wellbutrin.  No changes today       Relevant Medications   buPROPion (WELLBUTRIN XL) 300 MG 24 hr tablet      I have changed Kristina Barnes's buPROPion. I am also having her maintain her fish oil-omega-3 fatty acids, Ginkgo Biloba (GINKOBA PO), Lysine, loratadine, levothyroxine, Vitamin D3, diclofenac sodium, and levothyroxine.  Meds ordered this encounter  Medications  . buPROPion (WELLBUTRIN XL) 300 MG 24 hr tablet    Sig: Take 1 tablet (300 mg total) by mouth daily.    Dispense:  90 tablet    Refill:  1    A total of 25 minutes of face to face time was spent with patient more than half of which was spent in counselling about the above mentioned conditions  and coordination of care  Medications Discontinued During This Encounter  Medication Reason  . buPROPion (WELLBUTRIN XL) 150 MG 24 hr tablet Reorder    Follow-up: No Follow-up on file.   Crecencio Mc, MD

## 2015-05-08 NOTE — Progress Notes (Signed)
Pre-visit discussion using our clinic review tool. No additional management support is needed unless otherwise documented below in the visit note.  

## 2015-05-09 NOTE — Assessment & Plan Note (Signed)
Thyroid function is WNL on current dose.  No current changes needed.   Lab Results  Component Value Date   TSH 0.63 12/15/2014

## 2015-05-09 NOTE — Assessment & Plan Note (Signed)
Recurrent,  Improving with increased dose of wellbutrin.  No changes today

## 2015-05-17 ENCOUNTER — Ambulatory Visit: Payer: PRIVATE HEALTH INSURANCE | Attending: Orthopedic Surgery | Admitting: Occupational Therapy

## 2015-05-17 DIAGNOSIS — M79631 Pain in right forearm: Secondary | ICD-10-CM | POA: Insufficient documentation

## 2015-05-17 DIAGNOSIS — M6281 Muscle weakness (generalized): Secondary | ICD-10-CM | POA: Insufficient documentation

## 2015-05-17 DIAGNOSIS — M25631 Stiffness of right wrist, not elsewhere classified: Secondary | ICD-10-CM | POA: Insufficient documentation

## 2015-05-17 NOTE — Patient Instructions (Signed)
Heat prior to ROM to increase flexion and extention   1lbs strenghening  Wrist flexion/extention - in neutral plane RD and UD - in standing  Sup/pro   All 10 -12 reps - can increase in 3 days to 2 sets   Green putty for grip , lateral grip and 3 point grip  10 reps

## 2015-05-17 NOTE — Therapy (Signed)
[facility] [address] [place], [address] Phone: [phone]   Fax:  [phone]  Occupational Therapy Treatment  Patient Details  Name: Kristina Barnes MRN: [id number] Date of Birth: 08-18-1956 Referring Provider:  Thornton Park, MD  Encounter Date: 05/17/2015      OT End of Session - 05/17/15 1242    Visit Number 1   Number of Visits 4   Date for OT Re-Evaluation 06/14/15   OT Start Time 0805   OT Stop Time 0901   OT Time Calculation (min) 56 min   Activity Tolerance Patient tolerated treatment well   Behavior During Therapy [facility] for tasks assessed/performed      Past Medical History  Diagnosis Date  . Allergy   . Thyroid disease   . Migraine y-41  . Menopause syndrome   . Sensory neuropathy     Past Surgical History  Procedure Laterality Date  . Appendectomy      There were no vitals filed for this visit.  Visit Diagnosis:  Pain of right forearm - Plan: Ot plan of care cert/re-cert  Muscle weakness - Plan: Ot plan of care cert/re-cert  Stiffness of wrist joint, right - Plan: Ot plan of care cert/re-cert      Subjective Assessment - 05/17/15 1020    Subjective  Fell in July last year and thought sprain but later with MRI found distal ulnar styloid fracture - still have pain and going on year now   Patient Stated Goals Wants to get my wrist pain free that I can do things pain free   Currently in Pain? Yes   Pain Score 1    Pain Location Wrist   Pain Orientation Right   Pain Descriptors / Indicators Aching   Pain Onset More than a month ago   Pain Frequency Intermittent   Aggravating Factors  Using it , moving , lifthing , twisting             [facility] OT Assessment - 05/17/15 0001    Assessment   Diagnosis R wrist pain - history of R ulna styloid fx - with capsulitis   Onset Date 04/13/14   Assessment Pt fell last July - had 2 shots last year in wrist - of whick one  helped for pain - had MR arthrogram  in Jan - showed that showed capsulitis, and ligaments  intact  - Dr Tamala Julian did noted FCU  tendonitis maybe    Home  Environment   Lives With Spouse   Prior Function   Level of [place] --  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, reading , walking,  - things that bother her is liftting, hand writing, computer, gardening , laundry, driving for more than 15 min turning , water can   AROM   Right Forearm Pronation 90 Degrees  popping   Right Forearm Supination 90 Degrees  popping   Left Forearm Pronation 90 Degrees   Left Forearm Supination 90 Degrees   Right Wrist Extension 77 Degrees   Right Wrist Flexion 80 Degrees   Right Wrist Radial Deviation 22 Degrees   Right Wrist Ulnar Deviation 27 Degrees   Left Wrist Extension 85 Degrees   Left Wrist Flexion 88 Degrees   Left Wrist Radial Deviation 26 Degrees   Left Wrist Ulnar Deviation 25 Degrees   Strength   Right Hand Grip (lbs) 50   Right Hand Lateral Pinch 14  lbs   Right Hand 3 Point Pinch 14 lbs   Left Hand Grip (lbs) 50   Left Hand Lateral Pinch 15 lbs   Left Hand 3 Point Pinch 15 lbs                  OT Treatments/Exercises (OP) - 05/17/15 0001    RUE Fluidotherapy   Number Minutes Fluidotherapy 10 Minutes   RUE Fluidotherapy Location Hand;Wrist   Comments AROM of wrist in all planes - popping decrease in pronation after fluido and had increase flexoin and extention ROM                 OT Education - 05/17/15 1242    Education provided Yes   Education Details HEP and finding of eval    Person(s) Educated Patient   Methods Explanation;Demonstration;Tactile cues;Verbal cues;Handout   Comprehension Tactile cues required;Verbal cues required;Returned demonstration;Verbalized understanding          OT Short Term Goals - 05/17/15 1247    OT SHORT TERM GOAL #1   Title AROM at R wrist improve to WNL in all planes to use in gardening and  home making activities    Baseline Ext 77, flexion 80 ---sup and pro popping prior to fluido   Time 3   Period Weeks   Status New   OT SHORT TERM GOAL #2   Title Pain improve to 5/50 or less on PRWHE   Baseline Pain at the worse 3/10  and with lifting and repetive work - so at least 12/50   Time 3   Period Weeks   Status New           OT Long Term Goals - 05/17/15 1250    OT LONG TERM GOAL #1   Title Strength in R wrist improve in all planes to at least 4+/5 and able to keep normal alignment during exercises and use - less pain durig driving    Baseline During exercises - wrist radial deviating    Time 4   Period Weeks   Status New               Plan - 05/17/15 1243    Clinical Impression Statement Pt present more than year out of ulnar styloid fracture - had 2 shots last year - still have persistant pain with certain actvities during day - pt do show decrease flexion and extention end range , popping in sup and pronation - but MRI showed ligaments intact - grip even with non dominant hand - pt during review of HEP show incrase RD during exercises-  could be inbalance of strength at wrist-  provided HEP for wrist in all planes and  for grip/prehension    Pt will benefit from skilled therapeutic intervention in order to improve on the following deficits (Retired) Impaired flexibility;Decreased range of motion;Decreased strength   Rehab Potential Good   OT Frequency 1x / week   OT Duration 4 weeks   OT Treatment/Interventions Self-care/ADL training;Fluidtherapy;Therapeutic exercise;Manual Therapy;Patient/family education   Plan Assess pain , popping in sup/pro and if need to change to Isometric first    OT Home Exercise Plan see pt instruction   Consulted and Agree with Plan of Care Patient        Problem List Patient Active Problem List   Diagnosis Date Noted  . Depression, major, single episode, mild 12/18/2014  . Wrist capsulitis 12/15/2014  . Patellar sleeve  fracture of right knee 12/15/2014  . Routine  general medical examination at a health care facility 01/02/2014  . Vitamin D deficiency 01/02/2014  . Hypothyroidism 02/05/2013  . Menopause syndrome   . Sensory neuropathy     Rosalyn Gess OTR/L,CLT 05/17/2015, 12:56 PM  [facility] [address] [place], [address] Phone: [phone]   Fax:  [phone]

## 2015-05-23 ENCOUNTER — Ambulatory Visit: Payer: PRIVATE HEALTH INSURANCE | Attending: Orthopedic Surgery | Admitting: Occupational Therapy

## 2015-05-23 DIAGNOSIS — M79631 Pain in right forearm: Secondary | ICD-10-CM | POA: Insufficient documentation

## 2015-05-23 DIAGNOSIS — M6281 Muscle weakness (generalized): Secondary | ICD-10-CM | POA: Diagnosis present

## 2015-05-23 DIAGNOSIS — M25631 Stiffness of right wrist, not elsewhere classified: Secondary | ICD-10-CM | POA: Diagnosis present

## 2015-05-23 NOTE — Therapy (Signed)
[facility] [address], [place], [address] Phone: [phone]   Fax:  [phone]  Occupational Therapy Treatment  Patient Details  Name: Kristina Barnes MRN: [id number] Date of Birth: [birth year] Referring Provider:  Thornton Park, MD  Encounter Date: 05/23/2015      OT End of Session - 05/23/15 0921    Visit Number 2   Number of Visits 4   Date for OT Re-Evaluation 06/14/15   OT Start Time 0845   OT Stop Time 0916   OT Time Calculation (min) 31 min   Activity Tolerance Patient tolerated treatment well   Behavior During Therapy [facility] for tasks assessed/performed      Past Medical History  Diagnosis Date  . Allergy   . Thyroid disease   . Migraine y-41  . Menopause syndrome   . Sensory neuropathy     Past Surgical History  Procedure Laterality Date  . Appendectomy      There were no vitals filed for this visit.  Visit Diagnosis:  Pain of right forearm  Muscle weakness  Stiffness of wrist joint, right      Subjective Assessment - 05/23/15 0851    Subjective  Exercises did okay - pain SUnday am and did not increase weight or reps - popping got little bit better - and last week I did go back to work and is the only thing different - did not had to use my voltarin ointment this past week   Patient Stated Goals Wants to get my wrist pain free that I can do things pain free   Currently in Pain? Yes   Pain Score 3    Pain Location Wrist   Pain Descriptors / Indicators Aching   Pain Type Chronic pain   Pain Onset More than a month ago   Pain Frequency Intermittent            [facility] OT Assessment - 05/23/15 0001    AROM   Right Wrist Extension 85 Degrees   Right Wrist Flexion 88 Degrees   Right Wrist Radial Deviation 28 Degrees   Right Wrist Ulnar Deviation 27 Degrees                  OT Treatments/Exercises (OP) - 05/23/15 0001    Wrist Exercises   Other wrist  exercises 2 lbs for sup/pro , cont with teal putty    Other wrist exercises Isometric for RD and UD end range - 1 sec each ; Wrist flexion end range  and extenstion but isolating Ulnar and radial side of both end ranges    RUE Fluidotherapy   Number Minutes Fluidotherapy 10 Minutes   RUE Fluidotherapy Location Hand;Wrist   Comments AROM to decreaese pain prior to isometric strenghtening                 OT Education - 05/23/15 0921    Education provided Yes   Education Details HEP   Person(s) Educated Patient   Methods Explanation;Demonstration;Tactile cues;Verbal cues   Comprehension Verbal cues required;Returned demonstration;Verbalized understanding          OT Short Term Goals - 05/17/15 1247    OT SHORT TERM GOAL #1   Title AROM at R wrist improve to WNL in all planes to use in gardening and home making activities    Baseline Ext 77, flexion 80 ---sup and pro popping prior to fluido   Time 3   Period Weeks  Status New   OT SHORT TERM GOAL #2   Title Pain improve to 5/50 or less on PRWHE   Baseline Pain at the worse 3/10  and with lifting and repetive work - so at least 12/50   Time 3   Period Weeks   Status New           OT Long Term Goals - 05/17/15 1250    OT LONG TERM GOAL #1   Title Strength in R wrist improve in all planes to at least 4+/5 and able to keep normal alignment during exercises and use - less pain durig driving    Baseline During exercises - wrist radial deviating    Time 4   Period Weeks   Status New               Plan - 05/23/15 5284    Clinical Impression Statement Pt report she did had no increase pain and was able to not have to use voltarin ointment last week - but did return back to work this week and had Sunday am some pain - change to isometric to target mucle more direct - upgraded and change - ROM at wrist did improve see flowsheet   Pt will benefit from skilled therapeutic intervention in order to improve on the  following deficits (Retired) Impaired flexibility;Decreased range of motion;Decreased strength   Rehab Potential Good   OT Frequency 1x / week   OT Duration 4 weeks   OT Treatment/Interventions Self-care/ADL training;Fluidtherapy;Therapeutic exercise;Manual Therapy;Patient/family education   Plan assess pain and HEP    OT Home Exercise Plan see pt instruction   Consulted and Agree with Plan of Care Patient        Problem List Patient Active Problem List   Diagnosis Date Noted  . Depression, major, single episode, mild 12/18/2014  . Wrist capsulitis 12/15/2014  . Patellar sleeve fracture of right knee 12/15/2014  . Routine general medical examination at a health care facility 01/02/2014  . Vitamin D deficiency 01/02/2014  . Hypothyroidism 02/05/2013  . Menopause syndrome   . Sensory neuropathy     Rosalyn Gess OTR/L,CLT 05/23/2015, 9:23 AM  [facility] [address] [place], [address] Phone: [phone]   Fax:  [phone]

## 2015-05-23 NOTE — Patient Instructions (Signed)
Increase to 2 lbs for sup/pro   Change to isometric for wrist UD and RD  And flexion /extention end range all - and isolate ECU /ECR/FCU and FCR  Cont with teal putty   Can increase to 2 sets in 3-4 days if pain not increase

## 2015-05-24 ENCOUNTER — Encounter: Payer: Self-pay | Admitting: Occupational Therapy

## 2015-06-01 ENCOUNTER — Ambulatory Visit: Payer: PRIVATE HEALTH INSURANCE | Admitting: Occupational Therapy

## 2015-06-01 DIAGNOSIS — M25631 Stiffness of right wrist, not elsewhere classified: Secondary | ICD-10-CM

## 2015-06-01 DIAGNOSIS — M79631 Pain in right forearm: Secondary | ICD-10-CM

## 2015-06-01 DIAGNOSIS — M6281 Muscle weakness (generalized): Secondary | ICD-10-CM

## 2015-06-01 NOTE — Therapy (Signed)
[facility] [address] [place], [address] Phone: [phone]   Fax:  [phone]  Occupational Therapy Treatment  Patient Details  Name: Kristina Barnes MRN: [id number] Date of Birth: 11/29/1955 Referring Provider:  Thornton Park, MD  Encounter Date: 06/01/2015      OT End of Session - 06/01/15 0846    Visit Number 3   Number of Visits 4   Date for OT Re-Evaluation 06/14/15   OT Start Time 0808   OT Stop Time 0827   OT Time Calculation (min) 19 min   Activity Tolerance Patient tolerated treatment well   Behavior During Therapy [facility] for tasks assessed/performed      Past Medical History  Diagnosis Date  . Allergy   . Thyroid disease   . Migraine y-41  . Menopause syndrome   . Sensory neuropathy     Past Surgical History  Procedure Laterality Date  . Appendectomy      There were no vitals filed for this visit.  Visit Diagnosis:  Pain of right forearm  Muscle weakness  Stiffness of wrist joint, right      Subjective Assessment - 06/01/15 0842    Subjective  It is better - pain not as much - and I could pick up watering can with only my R hand - I can tell it is getting stronger- do not have to consentrate as hard to keep in midline and fro devaiing   Patient Stated Goals Wants to get my wrist pain free that I can do things pain free   Currently in Pain? No/denies                      OT Treatments/Exercises (OP) - 06/01/15 0001    Wrist Exercises   Other wrist exercises Assess strength at wrist in all planes 2 lbs for sup/pro and can move to bottom in 3 days - 20 reps   Other wrist exercises UD ito extention isometric , and then 2 lbs wrist extention into UD and RD over armrest - 12 reps ; standing UD and RD 2 lbs 12 reps  - cont with putty (green)                OT Education - 06/01/15 0846    Education provided Yes   Education Details HEP   Person(s)  Educated Patient   Methods Explanation;Demonstration;Tactile cues;Verbal cues   Comprehension Verbal cues required;Returned demonstration;Verbalized understanding          OT Short Term Goals - 06/01/15 0848    OT SHORT TERM GOAL #1   Title AROM at R wrist improve to WNL in all planes to use in gardening and home making activities    Baseline ROM WNL    Time 2   Period Weeks   Status On-going   OT SHORT TERM GOAL #2   Title Pain improve to 5/50 or less on PRWHE   Baseline pain improving   Time 2   Period Weeks   Status On-going           OT Long Term Goals - 06/01/15 0849    OT LONG TERM GOAL #1   Title Strength in R wrist improve in all planes to at least 4+/5 and able to keep normal alignment during exercises and use - less pain durig driving    Baseline During exercises - wrist radial deviating    Time 3  Period Weeks   Status On-going               Plan - 06/01/15 0846    Clinical Impression Statement Pt making great progress - still popping into sup and pron but L wrist do same - still some pulling on Ulnar side of wrist during end range sup/weight for UD into extnetion - pt did compensate little with flexion during UD in standing - and weaker into UD  - so start HEP still with isometric - then go to weight    Pt will benefit from skilled therapeutic intervention in order to improve on the following deficits (Retired) Impaired flexibility;Decreased range of motion;Decreased strength   Rehab Potential Good   OT Frequency 1x / week   OT Duration 2 weeks   OT Treatment/Interventions Self-care/ADL training;Fluidtherapy;Therapeutic exercise;Manual Therapy;Patient/family education   Plan assess pain and HEP    OT Home Exercise Plan see pt instruction   Consulted and Agree with Plan of Care Patient        Problem List Patient Active Problem List   Diagnosis Date Noted  . Depression, major, single episode, mild 12/18/2014  . Wrist capsulitis 12/15/2014  .  Patellar sleeve fracture of right knee 12/15/2014  . Routine general medical examination at a health care facility 01/02/2014  . Vitamin D deficiency 01/02/2014  . Hypothyroidism 02/05/2013  . Menopause syndrome   . Sensory neuropathy     Rosalyn Gess OTR/L,CLT 06/01/2015, 8:50 AM  [facility] [address] [place], [address] Phone: [phone]   Fax:  [phone]

## 2015-06-01 NOTE — Patient Instructions (Signed)
Cont with 2 1/2 lbs for sup/pro but 20 reps  Isometric for UD into extention 2 x 12 reps   Then 2 1/2 lbs for Ext but into UD and RD - 12 reps each  Increase to 2 sets of 12 reps  And standing UD and RD - avoid going into flexion - 12 reps x 2

## 2015-06-07 ENCOUNTER — Ambulatory Visit: Payer: PRIVATE HEALTH INSURANCE | Admitting: Occupational Therapy

## 2015-06-07 DIAGNOSIS — M79631 Pain in right forearm: Secondary | ICD-10-CM | POA: Diagnosis not present

## 2015-06-07 DIAGNOSIS — M6281 Muscle weakness (generalized): Secondary | ICD-10-CM

## 2015-06-07 DIAGNOSIS — M25631 Stiffness of right wrist, not elsewhere classified: Secondary | ICD-10-CM

## 2015-06-07 NOTE — Patient Instructions (Signed)
Green putty and to add dark blue in week  Add twisting with some wrist extention /UD  To to BW and FW - and others cont   Stop sup/pro.RD and UD  And only do wrist extention neutral and into RD with extention - work to 15 reps and then 20 and then 2 sets

## 2015-06-07 NOTE — Therapy (Signed)
[facility] [address] [place], [address] Phone: [phone]   Fax:  [phone]  Occupational Therapy Treatment  Patient Details  Name: Kristina Barnes MRN: [id number] Date of Birth: 06-25-1956 Referring Provider:  Thornton Park, MD  Encounter Date: 06/07/2015      OT End of Session - 06/07/15 0857    Visit Number 4   Number of Visits 4   Date for OT Re-Evaluation 06/14/15   OT Start Time 0832   OT Stop Time 0850   OT Time Calculation (min) 18 min   Activity Tolerance Patient tolerated treatment well   Behavior During Therapy [facility] for tasks assessed/performed      Past Medical History  Diagnosis Date  . Allergy   . Thyroid disease   . Migraine y-41  . Menopause syndrome   . Sensory neuropathy     Past Surgical History  Procedure Laterality Date  . Appendectomy      There were no vitals filed for this visit.  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 No/denies            [facility] OT Assessment - 06/07/15 0001    Strength   Right Hand Grip (lbs) 57   Right Hand Lateral Pinch 15 lbs   Right Hand 3 Point Pinch 13 lbs   Left Hand Grip (lbs) 55   Left Hand Lateral Pinch 15 lbs   Left Hand 3 Point Pinch 14 lbs                  OT Treatments/Exercises (OP) - 06/07/15 0001    Wrist Exercises   Other wrist exercises Assess grip and prehension  - new green putty that is harder provided and dark blue to add to it in week  to increase resistance ; Grip, Lat , 3 point grip - add pulling in plane where using some wrist extensores and add twisting with all digits FW /BW   Other wrist exercises Pt still wants to RD when ask to extend wrist - pt to stop doing sup/pro/UD and RD to side and only do wrist extention 2 lbs midline , and into UD wrist extention 15 reps easy                 OT Education - 06/07/15 0857    Education provided Yes   Education Details HEP   Person(s) Educated Patient   Methods Explanation;Demonstration;Tactile cues;Verbal cues   Comprehension Verbalized understanding;Returned demonstration;Verbal cues required          OT Short Term Goals - 06/07/15 0900    OT SHORT TERM GOAL #1   Title AROM at R wrist improve to WNL in all planes to use in gardening and home making activities    Baseline ROM WNL    Status Achieved   OT SHORT TERM  GOAL #2   Title Pain improve to 5/50 or less on PRWHE   Baseline pain improving   Time 2   Period Weeks   Status On-going           OT Long Term Goals - 06/07/15 0900    OT LONG TERM GOAL #1   Title Strength in R wrist improve in all planes to at least 4+/5 and able to keep normal alignment during exercises and use - less pain durig driving    Baseline During exercises - wrist radial deviating    Time 3   Status On-going               Plan - 06/07/15 9767    Clinical Impression Statement Pt report decrease pain over all and increase use - appear when doing grip with twist than involve ECU pt still has some trouble or pain aftewards - still deviating some what in RD during wrist extention  - HEP now more isolating ECU - putty upgrade - grip did improve 7 lbs - pt to cont with HEP for 2 wks this time and check back    Pt will benefit from skilled therapeutic intervention in order to improve on the following deficits (Retired) Impaired flexibility;Decreased range of motion;Decreased strength   OT Frequency Biweekly   OT  Treatment/Interventions Self-care/ADL training;Fluidtherapy;Therapeutic exercise;Manual Therapy;Patient/family education   Plan assess pain and function - HEP    OT Home Exercise Plan see pt instruction   Consulted and Agree with Plan of Care Patient        Problem List Patient Active Problem List   Diagnosis Date Noted  . Depression, major, single episode, mild 12/18/2014  . Wrist capsulitis 12/15/2014  . Patellar sleeve fracture of right knee 12/15/2014  . Routine general medical examination at a health care facility 01/02/2014  . Vitamin D deficiency 01/02/2014  . Hypothyroidism 02/05/2013  . Menopause syndrome   . Sensory neuropathy     Rosalyn Gess OTR/L,CLT 06/07/2015, 9:01 AM  [facility] [address], [place], [address] Phone: [phone]   Fax:  [phone]

## 2015-06-21 ENCOUNTER — Ambulatory Visit: Payer: Worker's Compensation | Attending: Orthopedic Surgery | Admitting: Occupational Therapy

## 2015-06-21 DIAGNOSIS — M25631 Stiffness of right wrist, not elsewhere classified: Secondary | ICD-10-CM | POA: Insufficient documentation

## 2015-06-21 DIAGNOSIS — M79631 Pain in right forearm: Secondary | ICD-10-CM | POA: Insufficient documentation

## 2015-06-21 DIAGNOSIS — M6281 Muscle weakness (generalized): Secondary | ICD-10-CM | POA: Diagnosis present

## 2015-06-21 NOTE — Patient Instructions (Signed)
Cont with wrist exercises - still deviating into RD  And then add wall pushups and then floor   And gradually increase weight if starting to work out

## 2015-06-21 NOTE — Therapy (Signed)
[place] [facility] [address], [place], [address] Phone: [phone]   Fax:  [phone]  Occupational Therapy Treatment and discharge  Patient Details  Name: Kristina Barnes MRN: [id number] Date of Birth: [birth year] Referring Provider:  Thornton Park, MD  Encounter Date: 06/21/2015      OT End of Session - 06/21/15 1349    Visit Number 5   Number of Visits 5   Date for OT Re-Evaluation 06/21/15   OT Start Time 0805   OT Stop Time 0830   OT Time Calculation (min) 25 min   Activity Tolerance Patient tolerated treatment well   Behavior During Therapy [facility] for tasks assessed/performed      Past Medical History  Diagnosis Date  . Allergy   . Thyroid disease   . Migraine y-41  . Menopause syndrome   . Sensory neuropathy     Past Surgical History  Procedure Laterality Date  . Appendectomy      There were no vitals filed for this visit.  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 No/denies            [facility] OT Assessment - 06/21/15 0001    AROM   Right Wrist Extension 85 Degrees   Right Wrist Flexion 88 Degrees   Left Wrist Extension 85 Degrees   Left Wrist Flexion 88 Degrees   Strength   Right Hand Grip (lbs) 60   Right Hand Lateral Pinch 15 lbs   Right Hand 3 Point Pinch 14 lbs   Left Hand Grip (lbs) 55   Left Hand Lateral Pinch 15 lbs   Left Hand 3 Point Pinch 12 lbs                  OT Treatments/Exercises (OP) - 06/21/15 0001    Wrist Exercises   Other wrist exercises Assess ROM and grip/prehenision see flowsheet    Other wrist exercises Pt cont to do HEP for wrist 2-3 lbs until not favoring RD - but can go midline without paying attention , dark blue putty cone with - gradually increase when working out - wall pushups 1 0reps and later to on knees and somethign unde palm , normal                 OT Education - 06/21/15 1349    Education provided Yes   Education Details HEP and discharge instructions    Person(s) Educated Patient   Methods Explanation;Demonstration;Tactile cues;Verbal cues   Comprehension Returned demonstration;Verbalized understanding;Verbal cues required          OT Short Term Goals - 06/21/15 1351    OT SHORT TERM GOAL #1   Title AROM at R wrist improve to WNL in all planes to use in gardening and home making activities    Status Achieved   OT SHORT TERM GOAL #2   Title Pain improve  to 5/50 or less on PRWHE   Status Achieved           OT Long Term Goals - 06/21/15 1351    OT LONG TERM GOAL #1   Title Strength in R wrist improve in all planes to at least 4+/5 and able to keep normal alignment during exercises and use - less pain durig driving    Status Achieved               Plan - 06/21/15 1350    Clinical Impression Statement Pt's AROM in wrist WNL - but need to cont with wrist extention stretches and then strengthening - had issue for about year and ECU favoring - cont  and gradually increase weight - grip and prehension stronger now in dominnat R hand - pt discharge with HEP    Rehab Potential Good   OT Treatment/Interventions Self-care/ADL training;Fluidtherapy;Therapeutic exercise;Manual Therapy;Patient/family education   Plan discharge with HEP    OT Home Exercise Plan see pt instruction   Consulted and Agree with Plan of Care Patient        Problem List Patient Active Problem List   Diagnosis Date Noted  . Depression, major, single episode, mild  12/18/2014  . Wrist capsulitis 12/15/2014  . Patellar sleeve fracture of right knee 12/15/2014  . Routine general medical examination at a health care facility 01/02/2014  . Vitamin D deficiency 01/02/2014  . Hypothyroidism 02/05/2013  . Menopause syndrome   . Sensory neuropathy     Rosalyn Gess OTR/L,CLT 06/21/2015, 1:52 PM  [place] [facility] [address], [place], [address] Phone: [phone]   Fax:  [phone]

## 2015-07-12 ENCOUNTER — Other Ambulatory Visit: Payer: Self-pay | Admitting: Orthopedic Surgery

## 2015-07-12 DIAGNOSIS — S82044A Nondisplaced comminuted fracture of right patella, initial encounter for closed fracture: Secondary | ICD-10-CM

## 2015-07-17 ENCOUNTER — Ambulatory Visit
Admission: RE | Admit: 2015-07-17 | Discharge: 2015-07-17 | Disposition: A | Discharge status: Home / Self Care | Payer: PRIVATE HEALTH INSURANCE | Source: Ambulatory Visit | Attending: Orthopedic Surgery | Admitting: Orthopedic Surgery

## 2015-07-17 ENCOUNTER — Ambulatory Visit: Payer: 59

## 2015-07-17 DIAGNOSIS — X58XXXD Exposure to other specified factors, subsequent encounter: Secondary | ICD-10-CM | POA: Diagnosis not present

## 2015-07-17 DIAGNOSIS — S82044D Nondisplaced comminuted fracture of right patella, subsequent encounter for closed fracture with routine healing: Secondary | ICD-10-CM | POA: Diagnosis not present

## 2015-07-17 DIAGNOSIS — S82044A Nondisplaced comminuted fracture of right patella, initial encounter for closed fracture: Secondary | ICD-10-CM

## 2015-07-17 DIAGNOSIS — S82044K Nondisplaced comminuted fracture of right patella, subsequent encounter for closed fracture with nonunion: Secondary | ICD-10-CM | POA: Diagnosis present

## 2015-09-27 ENCOUNTER — Encounter: Payer: Self-pay | Admitting: Internal Medicine

## 2015-09-27 ENCOUNTER — Ambulatory Visit (INDEPENDENT_AMBULATORY_CARE_PROVIDER_SITE_OTHER): Payer: 59 | Admitting: Internal Medicine

## 2015-09-27 VITALS — BP 106/70 | HR 63 | Temp 97.8°F | Resp 12 | Ht 66.0 in | Wt 145.8 lb

## 2015-09-27 DIAGNOSIS — R140 Abdominal distension (gaseous): Secondary | ICD-10-CM

## 2015-09-27 DIAGNOSIS — E038 Other specified hypothyroidism: Secondary | ICD-10-CM | POA: Diagnosis not present

## 2015-09-27 DIAGNOSIS — F320 Major depressive disorder, single episode, mild: Secondary | ICD-10-CM

## 2015-09-27 DIAGNOSIS — R232 Flushing: Secondary | ICD-10-CM

## 2015-09-27 DIAGNOSIS — Z111 Encounter for screening for respiratory tuberculosis: Secondary | ICD-10-CM

## 2015-09-27 DIAGNOSIS — Z1239 Encounter for other screening for malignant neoplasm of breast: Secondary | ICD-10-CM | POA: Diagnosis not present

## 2015-09-27 NOTE — Patient Instructions (Addendum)
[phone].  Call me for drinks, dinner or if you want to try out my exercise group  We meet 5 am Mon and Wed  7:30 am on saturdays   Kristina Barnes is my trainer  [phone]

## 2015-09-27 NOTE — Progress Notes (Signed)
Pre-visit discussion using our clinic review tool. No additional management support is needed unless otherwise documented below in the visit note.  

## 2015-09-27 NOTE — Progress Notes (Signed)
Subjective:  Patient ID: Kristina Barnes, female    DOB: 24-Jul-1956  Age: 59 y.o. MRN: [id number]  CC: The primary encounter diagnosis was Breast cancer screening. Diagnoses of Bloating symptom, Flushing, Other specified hypothyroidism, Screening for tuberculosis, Depression, major, single episode, mild ([place]), and Flushing reaction were also pertinent to this visit.  HPI Kristina Barnes presents for follow up on major depressive disorder.  She was last seen in July and had self scored 15 on the PHQ9 Depression scale .  Wellbutrin dose was increased to 300 mg daily.  She reports that she is feeling better,  And has noted an improvement by self score on the PHQ9 .  However, she continues to feel "flat" .  She has not resumed exercising.  She notes no stressors at home or at work. No insomnia or anorexia.  Denies suicidality.   She also notes a return of hot flashes,  Which have not been present for over 6 years, along with pelvic bloating still has ovaries     Outpatient Prescriptions Prior to Visit  Medication Sig Dispense Refill  . buPROPion (WELLBUTRIN XL) 300 MG 24 hr tablet Take 1 tablet (300 mg total) by mouth daily. 90 tablet 1  . Cholecalciferol (VITAMIN D3) 1000 UNITS CAPS Take 1 capsule by mouth daily.    . diclofenac sodium (VOLTAREN) 1 % GEL Apply 2 g topically 4 (four) times daily as needed.    Marland Kitchen levothyroxine (SYNTHROID, LEVOTHROID) 75 MCG tablet Take 1 tablet (75 mcg total) by mouth daily before breakfast. TAKE ONE TABLET BY MOUTH AT BEDTIME 90 tablet 1  . levothyroxine (SYNTHROID, LEVOTHROID) 75 MCG tablet TAKE 1 TABLET BY MOUTH DAILY BEFORE BREAKFAST 90 tablet 2  . loratadine (CLARITIN) 10 MG tablet Take 1 tablet (10 mg total) by mouth at bedtime. 90 tablet 3  . fish oil-omega-3 fatty acids 1000 MG capsule Take 1 g by mouth at bedtime. Reported on 09/27/2015    . Lysine 500 MG TABS Take 1 tablet (500 mg total) by mouth at bedtime. (Patient not taking: Reported on  09/27/2015) 90 tablet 3  . Ginkgo Biloba (GINKOBA PO) Take 60 mg by mouth at bedtime.     No facility-administered medications prior to visit.    Review of Systems;  Patient denies headache, fevers, malaise, unintentional weight loss, skin rash, eye pain, sinus congestion and sinus pain, sore throat, dysphagia,  hemoptysis , cough, dyspnea, wheezing, chest pain, palpitations, orthopnea, edema, abdominal pain, nausea, melena, diarrhea, constipation, flank pain, dysuria, hematuria, urinary  Frequency, nocturia, numbness, tingling, seizures,  Focal weakness, Loss of consciousness,  Tremor, insomnia, depression, anxiety, and suicidal ideation.      Objective:  BP 106/70 mmHg  Pulse 63  Temp(Src) 97.8 F (36.6 C) (Oral)  Resp 12  Ht 5\' 6"  (1.676 m)  Wt 145 lb 12 oz (66.112 kg)  BMI 23.54 kg/m2  SpO2 98%  BP Readings from Last 3 Encounters:  09/27/15 106/70  05/08/15 108/70  12/15/14 98/68    Wt Readings from Last 3 Encounters:  09/27/15 145 lb 12 oz (66.112 kg)  05/08/15 147 lb 8 oz (66.906 kg)  12/15/14 146 lb 12 oz (66.565 kg)    General appearance: alert, cooperative and appears stated age Ears: normal TM's and external ear canals both ears Throat: lips, mucosa, and tongue normal; teeth and gums normal Neck: no adenopathy, no carotid bruit, supple, symmetrical, trachea midline and thyroid not enlarged, symmetric, no tenderness/mass/nodules Back: symmetric, no curvature. ROM normal.  No CVA tenderness. Lungs: clear to auscultation bilaterally Heart: regular rate and rhythm, S1, S2 normal, no murmur, click, rub or gallop Abdomen: soft, non-tender; bowel sounds normal; no masses,  no organomegaly Pulses: 2+ and symmetric Skin: Skin color, texture, turgor normal. No rashes or lesions Lymph nodes: Cervical, supraclavicular, and axillary nodes normal. Psych: affect normal, makes good eye contact. No fidgeting,  Smiles easily.  Denies suicidal thoughts    No results found for:  HGBA1C  Lab Results  Component Value Date   CREATININE 0.6 03/03/2014   CREATININE 0.6 12/31/2013    Lab Results  Component Value Date   WBC 5.8 12/31/2013   HGB 13.1 12/31/2013   HCT 39.5 12/31/2013   PLT 256.0 12/31/2013   GLUCOSE 74 03/03/2014   CHOL 189 12/31/2013   TRIG 40.0 12/31/2013   HDL 99.10 12/31/2013   LDLCALC 82 12/31/2013   ALT 15 03/03/2014   AST 20 03/03/2014   NA 141 03/03/2014   K 4.3 03/03/2014   CL 105 03/03/2014   CREATININE 0.6 03/03/2014   BUN 17 03/03/2014   CO2 29 03/03/2014   TSH 1.640 09/27/2015    Ct Knee Right Wo Contrast  07/17/2015  CLINICAL DATA:  Tripping injury 15 months ago landing with the knee flexed and with direct impaction of the patella. Patellar fracture. EXAM: CT OF THE right KNEE WITHOUT CONTRAST TECHNIQUE: Multidetector CT imaging of the right knee was performed according to the standard protocol. Multiplanar CT image reconstructions were also generated. COMPARISON:  03/04/2015 FINDINGS: The original patellar fracture appears to been primarily transverse but with some medial comminution resulting in a third intermediary fragment medially. There is bony bridging across all fragments, but posteriorly at the main transverse fracture plane there is up to 5 mm of healed and displacement (as on image 38, series 602) with the inferior fragment more anteriorly positioned in the superior fragment. There is also some mild bony deficiency along the anterior margin of the original fracture plane medially, as on images 40 through 43 of series 602. However, the appearance is not typical for nonunion. The patella does appear somewhat elongated vertically, at 5.5 cm. The femoral trochlear groove is somewhat shallow in the patella is slightly medially subluxed. These factors in conjunction with the posttraumatic deformity of the PET patella may predispose to patellar maltracking. Tibial tubercle -trochlear groove distance 1.5 cm, within normal limits.  Borderline thickening of the patellar tendon. Upper normal amount of fluid in the knee joint, primarily along the patellofemoral articulation. Mild chondral thinning in the medial compartment. IMPRESSION: 1. No nonunion of the patellar fracture, which appears to have been a transverse fracture with some medial comminution. Although the fragments have healed together, there is some posttraumatic deformity including about 5 mm of anterior subluxation of the inferior fragment with respect to the superior, as well as some patellar elongation and irregularity along the posterior patellar surface and anteromedial patellar surface. The femoral trochlear groove is shallow and in conjunction with the posttraumatic patellar deformity this may predispose to maltracking. 2. Thickening of the proximal patellar tendon suggesting patellar tendinopathy. 3. Mild chondral thinning in the medial compartment. Electronically Signed   By: Van Clines M.D.   On: 07/17/2015 08:49    Assessment & Plan:   Problem List Items Addressed This Visit    Hypothyroidism    Thyroid function is WNL on current dose.  No current changes needed.       Relevant Orders   T4 AND TSH (  Completed)   Depression, major, single episode, mild ([place])    Improving with higher dose of wellbutrin.  Discussed nonpharmacologic modifications to imporve depression.  Encouraged to resume exercise. No medication changes today       Flushing reaction    Accompanied by pelvic bloating and fullness.  Need to rule out CA .  CA 125 and ultrasound ordered.       Other Visit Diagnoses    Breast cancer screening    -  Primary    Relevant Orders    MM DIGITAL SCREENING BILATERAL    Bloating symptom        Relevant Orders    CA 125 (Completed)    US Transvaginal Non-OB    US Pelvis Complete    Flushing        Relevant Orders    CA 125 (Completed)    US Transvaginal Non-OB    US Pelvis Complete    Screening for tuberculosis        Relevant  Orders    PPD (Completed)      A total of 25 minutes of face to face time was spent with patient more than half of which was spent in counselling about the above mentioned conditions  and coordination of care   I have discontinued Ms. Sease's Ginkgo Biloba (GINKOBA PO). I am also having her maintain her fish oil-omega-3 fatty acids, Lysine, loratadine, levothyroxine, Vitamin D3, diclofenac sodium, levothyroxine, buPROPion, TURMERIC PO, and (Ginger, Zingiber officinalis, (GINGER PO)).  Meds ordered this encounter  Medications  . TURMERIC PO    Sig: Take 1 capsule by mouth 2 (two) times daily.  . Ginger, Zingiber officinalis, (GINGER PO)    Sig: Take 1 capsule by mouth 2 (two) times daily.    Medications Discontinued During This Encounter  Medication Reason  . Ginkgo Biloba (GINKOBA PO) Error    Follow-up: No Follow-up on file.   Crecencio Mc, MD

## 2015-09-28 LAB — T4 AND TSH
T4 TOTAL: 7.6 ug/dL (ref 4.5–12.0)
TSH: 1.64 u[IU]/mL (ref 0.450–4.500)

## 2015-09-28 LAB — CA 125: CA 125: 20 U/mL (ref <35)

## 2015-09-29 ENCOUNTER — Encounter: Payer: Self-pay | Admitting: Internal Medicine

## 2015-09-29 DIAGNOSIS — R232 Flushing: Secondary | ICD-10-CM | POA: Insufficient documentation

## 2015-09-29 NOTE — Assessment & Plan Note (Signed)
Accompanied by pelvic bloating and fullness.  Need to rule out CA .  CA 125 and ultrasound ordered.

## 2015-09-29 NOTE — Assessment & Plan Note (Signed)
Thyroid function is WNL on current dose.  No current changes needed.  

## 2015-09-29 NOTE — Assessment & Plan Note (Addendum)
Improving with higher dose of wellbutrin.  Discussed nonpharmacologic modifications to imporve depression.  Encouraged to resume exercise. No medication changes today

## 2015-10-03 ENCOUNTER — Ambulatory Visit
Admission: RE | Admit: 2015-10-03 | Discharge: 2015-10-03 | Disposition: A | Discharge status: Home / Self Care | Payer: 59 | Source: Ambulatory Visit | Attending: Internal Medicine | Admitting: Internal Medicine

## 2015-10-03 DIAGNOSIS — R140 Abdominal distension (gaseous): Secondary | ICD-10-CM | POA: Diagnosis not present

## 2015-10-03 DIAGNOSIS — R232 Flushing: Secondary | ICD-10-CM | POA: Diagnosis present

## 2015-10-04 ENCOUNTER — Encounter: Payer: Self-pay | Admitting: Internal Medicine

## 2015-10-06 ENCOUNTER — Other Ambulatory Visit: Payer: Self-pay | Admitting: Internal Medicine

## 2015-10-06 ENCOUNTER — Ambulatory Visit
Admission: RE | Admit: 2015-10-06 | Discharge: 2015-10-06 | Disposition: A | Discharge status: Home / Self Care | Payer: 59 | Source: Ambulatory Visit | Attending: Internal Medicine | Admitting: Internal Medicine

## 2015-10-06 DIAGNOSIS — Z1239 Encounter for other screening for malignant neoplasm of breast: Secondary | ICD-10-CM

## 2015-10-06 DIAGNOSIS — Z1231 Encounter for screening mammogram for malignant neoplasm of breast: Secondary | ICD-10-CM | POA: Diagnosis not present

## 2015-10-06 HISTORY — DX: Unspecified malignant neoplasm of skin, unspecified: C44.90

## 2015-10-10 ENCOUNTER — Encounter: Payer: Self-pay | Admitting: Internal Medicine

## 2015-11-07 ENCOUNTER — Other Ambulatory Visit: Payer: Self-pay | Admitting: Internal Medicine

## 2016-01-12 ENCOUNTER — Other Ambulatory Visit: Payer: Self-pay | Admitting: Internal Medicine

## 2016-05-03 ENCOUNTER — Encounter: Payer: Self-pay | Admitting: Internal Medicine

## 2016-05-03 ENCOUNTER — Ambulatory Visit (INDEPENDENT_AMBULATORY_CARE_PROVIDER_SITE_OTHER): Payer: 59 | Admitting: Internal Medicine

## 2016-05-03 ENCOUNTER — Ambulatory Visit: Payer: Self-pay | Admitting: Internal Medicine

## 2016-05-03 VITALS — BP 100/58 | HR 72 | Temp 98.2°F | Resp 16 | Wt 144.0 lb

## 2016-05-03 DIAGNOSIS — F320 Major depressive disorder, single episode, mild: Secondary | ICD-10-CM | POA: Diagnosis not present

## 2016-05-03 MED ORDER — BUPROPION HCL ER (XL) 300 MG PO TB24: 300.0000 mg | ORAL_TABLET | Freq: Every day | ORAL | Status: DC

## 2016-05-03 NOTE — Assessment & Plan Note (Addendum)
Doing well despite recent stressors including mother's diagnosis of ovarian Ca and patient's return to [place] for an extended visit.Marland Kitchen  No changes to medication today

## 2016-05-03 NOTE — Progress Notes (Signed)
Subjective:  Patient ID: Kristina Barnes, female    DOB: 1956/08/12  Age: 60 y.o. MRN: [id number]  CC: The encounter diagnosis was Depression, major, single episode, mild ([place]).  HPI Kristina Barnes presents for folllow up on depression treated with wellbutrin  She reports an improvement in her mood despite a Stressful summer.  Mother was diagnosed with metastatic ovarian cancer which presented with rectal prolapse.  She Spent 7 weeks in [place] with her mother and sisters. She reports that spending an extended time in [place] reinforced her reasons for leaving [place].  She denies any escalation of sympotms and wants to continue her current medications   Outpatient Medications Prior to Visit  Medication Sig Dispense Refill  . diclofenac sodium (VOLTAREN) 1 % GEL Apply 2 g topically 4 (four) times daily as needed.    . loratadine (CLARITIN) 10 MG tablet Take 1 tablet (10 mg total) by mouth at bedtime. 90 tablet 3  . Lysine 500 MG TABS Take 1 tablet (500 mg total) by mouth at bedtime. 90 tablet 3  . TURMERIC PO Take 1 capsule by mouth 2 (two) times daily.    Marland Kitchen buPROPion (WELLBUTRIN XL) 300 MG 24 hr tablet TAKE 1 TABLET (300 MG TOTAL) BY MOUTH DAILY. 90 tablet 1  . Cholecalciferol (VITAMIN D3) 1000 UNITS CAPS Take 1 capsule by mouth daily. Reported on 05/03/2016    . fish oil-omega-3 fatty acids 1000 MG capsule Take 1 g by mouth at bedtime. Reported on 05/03/2016    . Ginger, Zingiber officinalis, (GINGER PO) Take 1 capsule by mouth 2 (two) times daily. Reported on 05/03/2016    . levothyroxine (SYNTHROID, LEVOTHROID) 75 MCG tablet Take 1 tablet (75 mcg total) by mouth daily before breakfast. TAKE ONE TABLET BY MOUTH AT BEDTIME 90 tablet 1  . levothyroxine (SYNTHROID, LEVOTHROID) 75 MCG tablet TAKE 1 TABLET BY MOUTH DAILY BEFORE BREAKFAST 90 tablet 2   No facility-administered medications prior to visit.     Review of Systems;  Patient denies headache, fevers, malaise,  unintentional weight loss, skin rash, eye pain, sinus congestion and sinus pain, sore throat, dysphagia,  hemoptysis , cough, dyspnea, wheezing, chest pain, palpitations, orthopnea, edema, abdominal pain, nausea, melena, diarrhea, constipation, flank pain, dysuria, hematuria, urinary  Frequency, nocturia, numbness, tingling, seizures,  Focal weakness, Loss of consciousness,  Tremor, insomnia, depression, anxiety, and suicidal ideation.      Objective:  BP (!) 100/58   Pulse 72   Temp 98.2 F (36.8 C) (Oral)   Resp 16   Wt 144 lb (65.3 kg)   BMI 23.24 kg/m   BP Readings from Last 3 Encounters:  05/03/16 (!) 100/58  09/27/15 106/70  05/08/15 108/70    Wt Readings from Last 3 Encounters:  05/03/16 144 lb (65.3 kg)  09/27/15 145 lb 12 oz (66.1 kg)  05/08/15 147 lb 8 oz (66.9 kg)    General appearance: alert, cooperative and appears stated age Ears: normal TM's and external ear canals both ears Throat: lips, mucosa, and tongue normal; teeth and gums normal Neck: no adenopathy, no carotid bruit, supple, symmetrical, trachea midline and thyroid not enlarged, symmetric, no tenderness/mass/nodules Back: symmetric, no curvature. ROM normal. No CVA tenderness. Lungs: clear to auscultation bilaterally Heart: regular rate and rhythm, S1, S2 normal, no murmur, click, rub or gallop Abdomen: soft, non-tender; bowel sounds normal; no masses,  no organomegaly Pulses: 2+ and symmetric Skin: Skin color, texture, turgor normal. No rashes or lesions Lymph nodes: Cervical, supraclavicular, and  axillary nodes normal.  No results found for: HGBA1C  Lab Results  Component Value Date   CREATININE 0.6 03/03/2014   CREATININE 0.6 12/31/2013    Lab Results  Component Value Date   WBC 5.8 12/31/2013   HGB 13.1 12/31/2013   HCT 39.5 12/31/2013   PLT 256.0 12/31/2013   GLUCOSE 74 03/03/2014   CHOL 189 12/31/2013   TRIG 40.0 12/31/2013   HDL 99.10 12/31/2013   LDLCALC 82 12/31/2013   ALT 15  03/03/2014   AST 20 03/03/2014   NA 141 03/03/2014   K 4.3 03/03/2014   CL 105 03/03/2014   CREATININE 0.6 03/03/2014   BUN 17 03/03/2014   CO2 29 03/03/2014   TSH 1.640 09/27/2015    Mm Screening Breast Tomo Bilateral  10/06/2015  CLINICAL DATA:  Screening. EXAM: DIGITAL SCREENING BILATERAL MAMMOGRAM WITH 3D TOMO WITH CAD COMPARISON:  Previous exam(s). ACR Breast Density Category c: The breast tissue is heterogeneously dense, which may obscure small masses. FINDINGS: There are no findings suspicious for malignancy. Images were processed with CAD. IMPRESSION: No mammographic evidence of malignancy. A result letter of this screening mammogram will be mailed directly to the patient. RECOMMENDATION: Screening mammogram in one year. (Code:SM-B-01Y) BI-RADS CATEGORY  1: Negative. Electronically Signed   By: Claudie Revering M.D.   On: 10/06/2015 13:20    Assessment & Plan:   Problem List Items Addressed This Visit    Depression, major, single episode, mild ([place]) - Primary    Doing well despite recent stressors including mother's diagnosis of ovarian Ca and patient's return to [place] for an extended visit.Marland Kitchen  No changes to medication today       Relevant Medications   buPROPion (WELLBUTRIN XL) 300 MG 24 hr tablet    Other Visit Diagnoses   None.    I am having Ms. Inman maintain her fish oil-omega-3 fatty acids, Lysine, loratadine, levothyroxine, Vitamin D3, diclofenac sodium, TURMERIC PO, (Ginger, Zingiber officinalis, (GINGER PO)), and buPROPion.  Meds ordered this encounter  Medications  . buPROPion (WELLBUTRIN XL) 300 MG 24 hr tablet    Sig: Take 1 tablet (300 mg total) by mouth daily.    Dispense:  90 tablet    Refill:  1    Medications Discontinued During This Encounter  Medication Reason  . levothyroxine (SYNTHROID, LEVOTHROID) 75 MCG tablet Duplicate  . buPROPion (WELLBUTRIN XL) 300 MG 24 hr tablet Reorder    Follow-up: No Follow-up on file.   Crecencio Mc,  MD

## 2016-08-15 ENCOUNTER — Encounter: Payer: Self-pay | Admitting: Internal Medicine

## 2016-08-17 ENCOUNTER — Other Ambulatory Visit: Payer: Self-pay | Admitting: Internal Medicine

## 2016-08-17 MED ORDER — FLUTICASONE PROPIONATE 50 MCG/ACT NA SUSP: 2.0000 | Freq: Every day | NASAL | 6 refills | Status: DC

## 2016-08-17 MED ORDER — BUPROPION HCL ER (XL) 150 MG PO TB24: 150.0000 mg | ORAL_TABLET | Freq: Every day | ORAL | 0 refills | Status: DC

## 2016-09-17 DIAGNOSIS — H524 Presbyopia: Secondary | ICD-10-CM | POA: Diagnosis not present

## 2016-09-17 DIAGNOSIS — H43812 Vitreous degeneration, left eye: Secondary | ICD-10-CM | POA: Diagnosis not present

## 2016-09-17 DIAGNOSIS — H52223 Regular astigmatism, bilateral: Secondary | ICD-10-CM | POA: Diagnosis not present

## 2016-09-17 DIAGNOSIS — H5203 Hypermetropia, bilateral: Secondary | ICD-10-CM | POA: Diagnosis not present

## 2016-10-10 DIAGNOSIS — B078 Other viral warts: Secondary | ICD-10-CM | POA: Diagnosis not present

## 2016-10-10 DIAGNOSIS — L812 Freckles: Secondary | ICD-10-CM | POA: Diagnosis not present

## 2016-10-10 DIAGNOSIS — Z1283 Encounter for screening for malignant neoplasm of skin: Secondary | ICD-10-CM | POA: Diagnosis not present

## 2016-10-10 DIAGNOSIS — L570 Actinic keratosis: Secondary | ICD-10-CM | POA: Diagnosis not present

## 2016-10-10 DIAGNOSIS — D1800 Hemangioma unspecified site: Secondary | ICD-10-CM | POA: Diagnosis not present

## 2016-10-10 DIAGNOSIS — L821 Other seborrheic keratosis: Secondary | ICD-10-CM | POA: Diagnosis not present

## 2016-10-10 DIAGNOSIS — D229 Melanocytic nevi, unspecified: Secondary | ICD-10-CM | POA: Diagnosis not present

## 2016-10-10 DIAGNOSIS — D485 Neoplasm of uncertain behavior of skin: Secondary | ICD-10-CM | POA: Diagnosis not present

## 2016-10-10 DIAGNOSIS — Z85828 Personal history of other malignant neoplasm of skin: Secondary | ICD-10-CM | POA: Diagnosis not present

## 2016-10-23 ENCOUNTER — Other Ambulatory Visit: Payer: Self-pay | Admitting: Internal Medicine

## 2016-10-23 NOTE — Telephone Encounter (Signed)
Pt last refill of Levothyroxine was on 07/29/16, pt last labs were on 09/27/15 and last OV was 05/03/16. Ok to refill?

## 2016-11-04 ENCOUNTER — Other Ambulatory Visit: Payer: Self-pay | Admitting: Internal Medicine

## 2016-11-04 DIAGNOSIS — Z1231 Encounter for screening mammogram for malignant neoplasm of breast: Secondary | ICD-10-CM

## 2016-11-10 ENCOUNTER — Telehealth: Payer: 59 | Admitting: Physician Assistant

## 2016-11-10 DIAGNOSIS — R6889 Other general symptoms and signs: Secondary | ICD-10-CM | POA: Diagnosis not present

## 2016-11-10 MED ORDER — OSELTAMIVIR PHOSPHATE 75 MG PO CAPS: 75.0000 mg | ORAL_CAPSULE | Freq: Two times a day (BID) | ORAL | 0 refills | Status: DC

## 2016-11-10 NOTE — Progress Notes (Signed)
E visit for Flu like symptoms   We are sorry that you are not feeling well.  Here is how we plan to help! Based on what you have shared with me it looks like you may have a respiratory virus that may be influenza.  Influenza or "the flu" is   an infection caused by a respiratory virus. The flu virus is highly contagious and persons who did not receive their yearly flu vaccination may "catch" the flu from close contact.  We have anti-viral medications to treat the viruses that cause this infection. They are not a "cure" and only shorten the course of the infection. These prescriptions are most effective when they are given within the first 2 days of "flu" symptoms. Antiviral medication are indicated if you have a high risk of complications from the flu. You should  also consider an antiviral medication if you are in close contact with someone who is at risk. These medications can help patients avoid complications from the flu  but have side effects that you should know. Possible side effects from Tamiflu or oseltamivir include nausea, vomiting, diarrhea, dizziness, headaches, eye redness, sleep problems or other respiratory symptoms. You should not take Tamiflu if you have an allergy to oseltamivir or any to the ingredients in Tamiflu.  Based upon your symptoms and potential risk factors (close contact with those who have multiple health issues)  I have prescribed Oseltamivir (Tamiflu).  It has been sent to your designated pharmacy.  You will take one 75 mg capsule orally twice a day for the next 5 days.   ANYONE WHO HAS FLU SYMPTOMS SHOULD: . Stay home. The flu is highly contagious and going out or to work exposes others! . Be sure to drink plenty of fluids. Water is fine as well as fruit juices, sodas and electrolyte beverages. You may want to stay away from caffeine or alcohol. If you are nauseated, try taking small sips of liquids. How do you know if you are getting enough fluid? Your urine should  be a pale yellow or almost colorless. . Get rest. . Taking a steamy shower or using a humidifier may help nasal congestion and ease sore throat pain. Using a saline nasal spray works much the same way. . Cough drops, hard candies and sore throat lozenges may ease your cough. . Line up a caregiver. Have someone check on you regularly.   GET HELP RIGHT AWAY IF: . You cannot keep down liquids or your medications. . You become short of breath . Your fell like you are going to pass out or loose consciousness. . Your symptoms persist after you have completed your treatment plan MAKE SURE YOU   Understand these instructions.  Will watch your condition.  Will get help right away if you are not doing well or get worse.  Your e-visit answers were reviewed by a board certified advanced clinical practitioner to complete your personal care plan.  Depending on the condition, your plan could have included both over the counter or prescription medications.  If there is a problem please reply  once you have received a response from your provider.  Your safety is important to [place].  If you have drug allergies check your prescription carefully.    You can use MyChart to ask questions about today's visit, request a non-urgent call back, or ask for a work or school excuse for 24 hours related to this e-Visit. If it has been greater than 24 hours you will need  to follow up with your provider, or enter a new e-Visit to address those concerns.  You will get an e-mail in the next two days asking about your experience.  I hope that your e-visit has been valuable and will speed your recovery. Thank you for using e-visits.

## 2016-11-11 ENCOUNTER — Encounter: Payer: Self-pay | Admitting: Internal Medicine

## 2016-11-14 ENCOUNTER — Encounter: Payer: Self-pay | Admitting: Internal Medicine

## 2016-11-14 ENCOUNTER — Other Ambulatory Visit: Payer: Self-pay | Admitting: Internal Medicine

## 2016-11-14 ENCOUNTER — Telehealth: Payer: Self-pay | Admitting: *Deleted

## 2016-11-14 NOTE — Telephone Encounter (Signed)
Faxed signed [facility] note to [facility] per pt myChart message. See note dated 11/14/16 in letters.

## 2016-11-29 ENCOUNTER — Other Ambulatory Visit: Payer: Self-pay | Admitting: Internal Medicine

## 2016-11-29 ENCOUNTER — Ambulatory Visit
Admission: RE | Admit: 2016-11-29 | Discharge: 2016-11-29 | Disposition: A | Discharge status: Home / Self Care | Payer: 59 | Source: Ambulatory Visit | Attending: Internal Medicine | Admitting: Internal Medicine

## 2016-11-29 DIAGNOSIS — Z1231 Encounter for screening mammogram for malignant neoplasm of breast: Secondary | ICD-10-CM | POA: Diagnosis not present

## 2016-12-09 ENCOUNTER — Other Ambulatory Visit: Payer: Self-pay | Admitting: Internal Medicine

## 2016-12-10 MED ORDER — BUPROPION HCL ER (XL) 150 MG PO TB24: 150.0000 mg | ORAL_TABLET | Freq: Every day | ORAL | 3 refills | Status: DC

## 2016-12-11 DIAGNOSIS — L820 Inflamed seborrheic keratosis: Secondary | ICD-10-CM | POA: Diagnosis not present

## 2016-12-11 DIAGNOSIS — L570 Actinic keratosis: Secondary | ICD-10-CM | POA: Diagnosis not present

## 2016-12-11 DIAGNOSIS — L578 Other skin changes due to chronic exposure to nonionizing radiation: Secondary | ICD-10-CM | POA: Diagnosis not present

## 2016-12-16 ENCOUNTER — Telehealth: Payer: Self-pay | Admitting: Internal Medicine

## 2016-12-17 ENCOUNTER — Encounter: Payer: Self-pay | Admitting: *Deleted

## 2016-12-17 DIAGNOSIS — Z7689 Persons encountering health services in other specified circumstances: Secondary | ICD-10-CM

## 2016-12-17 NOTE — Telephone Encounter (Signed)
Completed FMLA forms faxed to number on form. Forms copied to be scanned into chart. Left detailed mess informing pt.

## 2017-01-13 ENCOUNTER — Encounter: Payer: Self-pay | Admitting: Internal Medicine

## 2017-01-13 ENCOUNTER — Ambulatory Visit (INDEPENDENT_AMBULATORY_CARE_PROVIDER_SITE_OTHER): Payer: 59 | Admitting: Internal Medicine

## 2017-01-13 VITALS — BP 98/74 | HR 63 | Temp 98.1°F | Wt 141.5 lb

## 2017-01-13 DIAGNOSIS — Z1211 Encounter for screening for malignant neoplasm of colon: Secondary | ICD-10-CM

## 2017-01-13 DIAGNOSIS — F320 Major depressive disorder, single episode, mild: Secondary | ICD-10-CM | POA: Diagnosis not present

## 2017-01-13 DIAGNOSIS — E039 Hypothyroidism, unspecified: Secondary | ICD-10-CM

## 2017-01-13 DIAGNOSIS — E7800 Pure hypercholesterolemia, unspecified: Secondary | ICD-10-CM | POA: Diagnosis not present

## 2017-01-13 DIAGNOSIS — R5383 Other fatigue: Secondary | ICD-10-CM

## 2017-01-13 DIAGNOSIS — M2550 Pain in unspecified joint: Secondary | ICD-10-CM

## 2017-01-13 LAB — CBC WITH DIFFERENTIAL/PLATELET
BASOS PCT: 0.7 % (ref 0.0–3.0)
Basophils Absolute: 0 10*3/uL (ref 0.0–0.1)
EOS ABS: 0.1 10*3/uL (ref 0.0–0.7)
Eosinophils Relative: 2.3 % (ref 0.0–5.0)
HEMATOCRIT: 39.5 % (ref 36.0–46.0)
Hemoglobin: 13.5 g/dL (ref 12.0–15.0)
LYMPHS ABS: 1.7 10*3/uL (ref 0.7–4.0)
Lymphocytes Relative: 39.5 % (ref 12.0–46.0)
MCHC: 34.1 g/dL (ref 30.0–36.0)
MCV: 95.6 fl (ref 78.0–100.0)
MONO ABS: 0.4 10*3/uL (ref 0.1–1.0)
Monocytes Relative: 10 % (ref 3.0–12.0)
NEUTROS ABS: 2.1 10*3/uL (ref 1.4–7.7)
Neutrophils Relative %: 47.5 % (ref 43.0–77.0)
PLATELETS: 281 10*3/uL (ref 150.0–400.0)
RBC: 4.13 Mil/uL (ref 3.87–5.11)
RDW: 13.1 % (ref 11.5–15.5)
WBC: 4.3 10*3/uL (ref 4.0–10.5)

## 2017-01-13 LAB — LIPID PANEL
CHOLESTEROL: 203 mg/dL — AB (ref 0–200)
HDL: 93.9 mg/dL (ref >39.00)
LDL Cholesterol: 98 mg/dL (ref 0–99)
NonHDL: 108.71
TRIGLYCERIDES: 56 mg/dL (ref 0.0–149.0)
Total CHOL/HDL Ratio: 2
VLDL: 11.2 mg/dL (ref 0.0–40.0)

## 2017-01-13 LAB — COMPREHENSIVE METABOLIC PANEL
ALBUMIN: 4.8 g/dL (ref 3.5–5.2)
ALT: 12 U/L (ref 0–35)
AST: 16 U/L (ref 0–37)
Alkaline Phosphatase: 69 U/L (ref 39–117)
BUN: 14 mg/dL (ref 6–23)
CALCIUM: 9.6 mg/dL (ref 8.4–10.5)
CO2: 28 mEq/L (ref 19–32)
CREATININE: 0.64 mg/dL (ref 0.40–1.20)
Chloride: 104 mEq/L (ref 96–112)
GFR: 100.64 mL/min (ref >60.00)
Glucose, Bld: 98 mg/dL (ref 70–99)
POTASSIUM: 4 meq/L (ref 3.5–5.1)
SODIUM: 140 meq/L (ref 135–145)
TOTAL PROTEIN: 7.4 g/dL (ref 6.0–8.3)
Total Bilirubin: 0.5 mg/dL (ref 0.2–1.2)

## 2017-01-13 LAB — SEDIMENTATION RATE: Sed Rate: 1 mm/hr (ref 0–30)

## 2017-01-13 NOTE — Progress Notes (Signed)
Subjective:  Patient ID: Kristina Barnes, female    DOB: 1956-06-20  Age: 61 y.o. MRN: [id number]  CC: The primary encounter diagnosis was Polyarthralgia. Diagnoses of Other fatigue, Acquired hypothyroidism, Pure hypercholesterolemia, Screening for colon cancer, Depression, major, single episode, mild ([place]), and Arthralgia, unspecified joint were also pertinent to this visit.  HPI Kristina Barnes presents for  follow up on multiple issues,    Due for colonoscopy  In May 2018. Discussed seeing Lucilla Lame  HIV screened prior to emigrating to [place] in 1995,  Has been married since then   Had coffee and mango/cashews   Fasting labs today,   Has RLS symptoms.  joint pains,  Brought on by sitting more one hour.  Shoulders.   mother has RA    Had derm procedure and use of aldara  Was prescribed .  She had a spot on her left medial zygoma "zapped," but developed a superficial infection which was treated with keflex  Friable  bleeds easily since procedure feb 28th  .   Outpatient Medications Prior to Visit  Medication Sig Dispense Refill  . Cholecalciferol (VITAMIN D3) 1000 UNITS CAPS Take 1 capsule by mouth daily. Reported on 05/03/2016    . diclofenac sodium (VOLTAREN) 1 % GEL Apply 2 g topically 4 (four) times daily as needed.    . fish oil-omega-3 fatty acids 1000 MG capsule Take 1 g by mouth at bedtime. Reported on 05/03/2016    . fluticasone (FLONASE) 50 MCG/ACT nasal spray Place 2 sprays into both nostrils daily. 16 g 6  . levothyroxine (SYNTHROID, LEVOTHROID) 75 MCG tablet Take 1 tablet (75 mcg total) by mouth daily before breakfast. TAKE ONE TABLET BY MOUTH AT BEDTIME 90 tablet 1  . loratadine (CLARITIN) 10 MG tablet Take 1 tablet (10 mg total) by mouth at bedtime. 90 tablet 3  . Lysine 500 MG TABS Take 1 tablet (500 mg total) by mouth at bedtime. 90 tablet 3  . TURMERIC PO Take 1 capsule by mouth 2 (two) times daily.    Marland Kitchen buPROPion (WELLBUTRIN XL) 150 MG 24 hr tablet Take 1 tablet  (150 mg total) by mouth daily. (Patient not taking: Reported on 01/13/2017) 30 tablet 3  . Ginger, Zingiber officinalis, (GINGER PO) Take 1 capsule by mouth 2 (two) times daily. Reported on 05/03/2016    . levothyroxine (SYNTHROID, LEVOTHROID) 75 MCG tablet TAKE 1 TABLET BY MOUTH DAILY BEFORE BREAKFAST (Patient not taking: Reported on 01/13/2017) 90 tablet 1  . oseltamivir (TAMIFLU) 75 MG capsule Take 1 capsule (75 mg total) by mouth 2 (two) times daily. (Patient not taking: Reported on 01/13/2017) 10 capsule 0   No facility-administered medications prior to visit.     Review of Systems;  Patient denies headache, fevers, malaise, unintentional weight loss, skin rash, eye pain, sinus congestion and sinus pain, sore throat, dysphagia,  hemoptysis , cough, dyspnea, wheezing, chest pain, palpitations, orthopnea, edema, abdominal pain, nausea, melena, diarrhea, constipation, flank pain, dysuria, hematuria, urinary  Frequency, nocturia, numbness, tingling, seizures,  Focal weakness, Loss of consciousness,  Tremor, insomnia, depression, anxiety, and suicidal ideation.      Objective:  BP 98/74 (BP Location: Left Arm, Patient Position: Sitting, Cuff Size: Normal)   Pulse 63   Temp 98.1 F (36.7 C) (Oral)   Wt 141 lb 8 oz (64.2 kg)   LMP 08/26/2012   SpO2 95%   BMI 22.84 kg/m   BP Readings from Last 3 Encounters:  01/13/17 98/74  05/03/16 Marland Kitchen)  100/58  09/27/15 106/70    Wt Readings from Last 3 Encounters:  01/13/17 141 lb 8 oz (64.2 kg)  05/03/16 144 lb (65.3 kg)  09/27/15 145 lb 12 oz (66.1 kg)    General appearance: alert, cooperative and appears stated age Ears: normal TM's and external ear canals both ears Throat: lips, mucosa, and tongue normal; teeth and gums normal Neck: no adenopathy, no carotid bruit, supple, symmetrical, trachea midline and thyroid not enlarged, symmetric, no tenderness/mass/nodules Back: symmetric, no curvature. ROM normal. No CVA tenderness. Lungs: clear to  auscultation bilaterally Heart: regular rate and rhythm, S1, S2 normal, no murmur, click, rub or gallop Abdomen: soft, non-tender; bowel sounds normal; no masses,  no organomegaly Pulses: 2+ and symmetric Skin: Skin color, texture, turgor normal. No rashes or lesions Lymph nodes: Cervical, supraclavicular, and axillary nodes normal.  No results found for: HGBA1C  Lab Results  Component Value Date   CREATININE 0.64 01/13/2017   CREATININE 0.6 03/03/2014   CREATININE 0.6 12/31/2013    Lab Results  Component Value Date   WBC 4.3 01/13/2017   HGB 13.5 01/13/2017   HCT 39.5 01/13/2017   PLT 281.0 01/13/2017   GLUCOSE 98 01/13/2017   CHOL 203 (H) 01/13/2017   TRIG 56.0 01/13/2017   HDL 93.90 01/13/2017   LDLCALC 98 01/13/2017   ALT 12 01/13/2017   AST 16 01/13/2017   NA 140 01/13/2017   K 4.0 01/13/2017   CL 104 01/13/2017   CREATININE 0.64 01/13/2017   BUN 14 01/13/2017   CO2 28 01/13/2017   TSH 1.250 01/13/2017    Mm Screening Breast Tomo Bilateral  Result Date: 12/02/2016 CLINICAL DATA:  Screening. EXAM: 2D DIGITAL SCREENING BILATERAL MAMMOGRAM WITH CAD AND ADJUNCT TOMO COMPARISON:  Previous exam(s). ACR Breast Density Category b: There are scattered areas of fibroglandular density. FINDINGS: There are no findings suspicious for malignancy. Images were processed with CAD. IMPRESSION: No mammographic evidence of malignancy. A result letter of this screening mammogram will be mailed directly to the patient. RECOMMENDATION: Screening mammogram in one year. (Code:SM-B-01Y) BI-RADS CATEGORY  1: Negative. Electronically Signed   By: Abelardo Diesel M.D.   On: 12/02/2016 08:50    Assessment & Plan:   Problem List Items Addressed This Visit    Depression, major, single episode, mild ([place])    She has had a recurrence of mild symptoms and considered resuming wellbutrin but ultimately decided not to resume therapy.       Hypothyroidism    Thyroid function is WNL on current dose.   No current changes needed.   . Lab Results  Component Value Date   TSH 1.250 01/13/2017         Relevant Orders   T4 AND TSH (Completed)   Joint pain    Given her multiple joint pains and maternal history of RA,  Screening was requested.  There were no signs of synovitis on exam,  And labs were normal.  Lab Results  Component Value Date   ESRSEDRATE 1 01/13/2017   Lab Results  Component Value Date   RF <14 01/13/2017         Screening for colon cancer    10 yr follow up screening is due,  referral to darren wohl       Relevant Orders   Ambulatory referral to Gastroenterology    Other Visit Diagnoses    Polyarthralgia    -  Primary   Relevant Orders   Sedimentation rate (Completed)  Rheumatoid Factor (Completed)   CBC with Differential/Platelet (Completed)   Other fatigue       Relevant Orders   Comprehensive metabolic panel (Completed)   Pure hypercholesterolemia       Relevant Orders   Lipid panel (Completed)      I am having Ms. Herard maintain her fish oil-omega-3 fatty acids, Lysine, loratadine, levothyroxine, Vitamin D3, diclofenac sodium, TURMERIC PO, (Ginger, Zingiber officinalis, (GINGER PO)), fluticasone, levothyroxine, oseltamivir, and buPROPion.  No orders of the defined types were placed in this encounter.   There are no discontinued medications.  Follow-up: No Follow-up on file.   Crecencio Mc, MD

## 2017-01-13 NOTE — Patient Instructions (Addendum)
PAP was normal/HPV  Negative March 2015  Referral to [facility]  for colonoscopy    You might want to try a premixed protein drink called Premier Protein shake for breakfast or late night snack . It is great tasting,   very low sugar and available of < $2 serving at [facility] and  In bulk for $1.50/serving at [employer] and [employer]  .    Nutritional analysis :  160 cal  30 g protein  1 g sugar 50% calcium needs   Vladimir Faster and [employer]

## 2017-01-13 NOTE — Progress Notes (Signed)
Pre visit review using our clinic review tool, if applicable. No additional management support is needed unless otherwise documented below in the visit note. 

## 2017-01-14 LAB — T4 AND TSH
T4, Total: 6.7 ug/dL (ref 4.5–12.0)
TSH: 1.25 u[IU]/mL (ref 0.450–4.500)

## 2017-01-14 LAB — RHEUMATOID FACTOR

## 2017-01-15 ENCOUNTER — Encounter: Payer: Self-pay | Admitting: Internal Medicine

## 2017-01-15 DIAGNOSIS — Z1211 Encounter for screening for malignant neoplasm of colon: Secondary | ICD-10-CM | POA: Insufficient documentation

## 2017-01-15 DIAGNOSIS — M2550 Pain in unspecified joint: Secondary | ICD-10-CM | POA: Insufficient documentation

## 2017-01-15 NOTE — Assessment & Plan Note (Signed)
She has had a recurrence of mild symptoms and considered resuming wellbutrin but ultimately decided not to resume therapy.

## 2017-01-15 NOTE — Assessment & Plan Note (Addendum)
Given her multiple joint pains and maternal history of RA,  Screening was requested.  There were no signs of synovitis on exam,  And labs were normal.  Lab Results  Component Value Date   ESRSEDRATE 1 01/13/2017   Lab Results  Component Value Date   RF <14 01/13/2017

## 2017-01-15 NOTE — Assessment & Plan Note (Signed)
Thyroid function is WNL on current dose.  No current changes needed.   . Lab Results  Component Value Date   TSH 1.250 01/13/2017

## 2017-01-15 NOTE — Assessment & Plan Note (Signed)
10 yr follow up screening is due,  referral to darren wohl

## 2017-01-17 ENCOUNTER — Telehealth: Payer: Self-pay | Admitting: Gastroenterology

## 2017-01-17 ENCOUNTER — Other Ambulatory Visit: Payer: Self-pay

## 2017-01-17 ENCOUNTER — Telehealth: Payer: Self-pay

## 2017-01-17 DIAGNOSIS — Z1211 Encounter for screening for malignant neoplasm of colon: Secondary | ICD-10-CM

## 2017-01-17 NOTE — Telephone Encounter (Signed)
01/17/17 Spoke with Berniece Salines with [facility] and NO prior Josem Kaufmann is required.

## 2017-01-17 NOTE — Telephone Encounter (Signed)
Gastroenterology Pre-Procedure Review   PATIENT REVIEW QUESTIONS: The patient responded to the following health history questions as indicated:    1. Are you having any GI issues? no 2. Do you have a personal history of Polyps? no 3. Do you have a family history of Colon Cancer or Polyps? no 4. Diabetes Mellitus? no 5. Joint replacements in the past 12 months?no 6. Major health problems in the past 3 months?no 7. Any artificial heart valves, MVP, or defibrillator?no    MEDICATIONS & ALLERGIES:    Patient reports the following regarding taking any anticoagulation/antiplatelet therapy:   Plavix, Coumadin, Eliquis, Xarelto, Lovenox, Pradaxa, Brilinta, or Effient? no Aspirin? no  Patient confirms/reports the following medications:  Current Outpatient Prescriptions  Medication Sig Dispense Refill  . buPROPion (WELLBUTRIN XL) 150 MG 24 hr tablet Take 1 tablet (150 mg total) by mouth daily. (Patient not taking: Reported on 01/13/2017) 30 tablet 3  . Cholecalciferol (VITAMIN D3) 1000 UNITS CAPS Take 1 capsule by mouth daily. Reported on 05/03/2016    . diclofenac sodium (VOLTAREN) 1 % GEL Apply 2 g topically 4 (four) times daily as needed.    . fish oil-omega-3 fatty acids 1000 MG capsule Take 1 g by mouth at bedtime. Reported on 05/03/2016    . fluticasone (FLONASE) 50 MCG/ACT nasal spray Place 2 sprays into both nostrils daily. 16 g 6  . Martika Egler, Zingiber officinalis, (Terelle Dobler PO) Take 1 capsule by mouth 2 (two) times daily. Reported on 05/03/2016    . levothyroxine (SYNTHROID, LEVOTHROID) 75 MCG tablet Take 1 tablet (75 mcg total) by mouth daily before breakfast. TAKE ONE TABLET BY MOUTH AT BEDTIME 90 tablet 1  . levothyroxine (SYNTHROID, LEVOTHROID) 75 MCG tablet TAKE 1 TABLET BY MOUTH DAILY BEFORE BREAKFAST (Patient not taking: Reported on 01/13/2017) 90 tablet 1  . loratadine (CLARITIN) 10 MG tablet Take 1 tablet (10 mg total) by mouth at bedtime. 90 tablet 3  . Lysine 500 MG TABS Take 1 tablet  (500 mg total) by mouth at bedtime. 90 tablet 3  . oseltamivir (TAMIFLU) 75 MG capsule Take 1 capsule (75 mg total) by mouth 2 (two) times daily. (Patient not taking: Reported on 01/13/2017) 10 capsule 0  . TURMERIC PO Take 1 capsule by mouth 2 (two) times daily.     No current facility-administered medications for this visit.     Patient confirms/reports the following allergies:  Allergies  Allergen Reactions  . Penicillins Rash  . Sulfa Antibiotics Rash    No orders of the defined types were placed in this encounter.   AUTHORIZATION INFORMATION Primary Insurance: 1D#: Group #:  Secondary Insurance: 1D#: Group #:  SCHEDULE INFORMATION: Date: 03/25/17 Time: Location: [place]

## 2017-03-25 ENCOUNTER — Ambulatory Visit: Payer: 59 | Admitting: Certified Registered"

## 2017-03-25 ENCOUNTER — Encounter
Admission: RE | Disposition: A | Discharge status: Home / Self Care | Payer: Self-pay | Source: Ambulatory Visit | Attending: Gastroenterology

## 2017-03-25 ENCOUNTER — Ambulatory Visit
Admission: RE | Admit: 2017-03-25 | Discharge: 2017-03-25 | Disposition: A | Discharge status: Home / Self Care | Payer: 59 | Source: Ambulatory Visit | Attending: Gastroenterology | Admitting: Gastroenterology

## 2017-03-25 DIAGNOSIS — K640 First degree hemorrhoids: Secondary | ICD-10-CM | POA: Insufficient documentation

## 2017-03-25 DIAGNOSIS — Z79899 Other long term (current) drug therapy: Secondary | ICD-10-CM | POA: Insufficient documentation

## 2017-03-25 DIAGNOSIS — Z85828 Personal history of other malignant neoplasm of skin: Secondary | ICD-10-CM | POA: Diagnosis not present

## 2017-03-25 DIAGNOSIS — Z8261 Family history of arthritis: Secondary | ICD-10-CM | POA: Insufficient documentation

## 2017-03-25 DIAGNOSIS — D122 Benign neoplasm of ascending colon: Secondary | ICD-10-CM

## 2017-03-25 DIAGNOSIS — F329 Major depressive disorder, single episode, unspecified: Secondary | ICD-10-CM | POA: Insufficient documentation

## 2017-03-25 DIAGNOSIS — K635 Polyp of colon: Secondary | ICD-10-CM | POA: Diagnosis not present

## 2017-03-25 DIAGNOSIS — E039 Hypothyroidism, unspecified: Secondary | ICD-10-CM | POA: Diagnosis not present

## 2017-03-25 DIAGNOSIS — D125 Benign neoplasm of sigmoid colon: Secondary | ICD-10-CM | POA: Insufficient documentation

## 2017-03-25 DIAGNOSIS — Z882 Allergy status to sulfonamides status: Secondary | ICD-10-CM | POA: Insufficient documentation

## 2017-03-25 DIAGNOSIS — Z8249 Family history of ischemic heart disease and other diseases of the circulatory system: Secondary | ICD-10-CM | POA: Insufficient documentation

## 2017-03-25 DIAGNOSIS — D124 Benign neoplasm of descending colon: Secondary | ICD-10-CM | POA: Insufficient documentation

## 2017-03-25 DIAGNOSIS — Z8041 Family history of malignant neoplasm of ovary: Secondary | ICD-10-CM | POA: Insufficient documentation

## 2017-03-25 DIAGNOSIS — Z880 Allergy status to penicillin: Secondary | ICD-10-CM | POA: Diagnosis not present

## 2017-03-25 DIAGNOSIS — Z1211 Encounter for screening for malignant neoplasm of colon: Secondary | ICD-10-CM | POA: Diagnosis not present

## 2017-03-25 DIAGNOSIS — K5790 Diverticulosis of intestine, part unspecified, without perforation or abscess without bleeding: Secondary | ICD-10-CM | POA: Diagnosis not present

## 2017-03-25 DIAGNOSIS — K5730 Diverticulosis of large intestine without perforation or abscess without bleeding: Secondary | ICD-10-CM | POA: Insufficient documentation

## 2017-03-25 HISTORY — PX: COLONOSCOPY WITH PROPOFOL: SHX5780

## 2017-03-25 SURGERY — COLONOSCOPY WITH PROPOFOL
Anesthesia: General

## 2017-03-25 MED ORDER — PHENYLEPHRINE HCL 10 MG/ML IJ SOLN
INTRAMUSCULAR | Status: AC
  Filled 2017-03-25: qty 1

## 2017-03-25 MED ORDER — SODIUM CHLORIDE 0.9 % IV SOLN
INTRAVENOUS | Status: DC
  Administered 2017-03-25: 1000 mL via INTRAVENOUS

## 2017-03-25 MED ORDER — LIDOCAINE 2% (20 MG/ML) 5 ML SYRINGE
INTRAMUSCULAR | Status: DC | PRN
  Administered 2017-03-25: 25 mg via INTRAVENOUS

## 2017-03-25 MED ORDER — PROPOFOL 500 MG/50ML IV EMUL
INTRAVENOUS | Status: DC | PRN
  Administered 2017-03-25: 120 ug/kg/min via INTRAVENOUS

## 2017-03-25 MED ORDER — LIDOCAINE HCL (PF) 2 % IJ SOLN
INTRAMUSCULAR | Status: AC
  Filled 2017-03-25: qty 2

## 2017-03-25 MED ORDER — PROPOFOL 10 MG/ML IV BOLUS
INTRAVENOUS | Status: DC | PRN
  Administered 2017-03-25: 30 mg via INTRAVENOUS
  Administered 2017-03-25: 70 mg via INTRAVENOUS

## 2017-03-25 MED ORDER — PROPOFOL 500 MG/50ML IV EMUL
INTRAVENOUS | Status: AC
  Filled 2017-03-25: qty 50

## 2017-03-25 MED ORDER — GLYCOPYRROLATE 0.2 MG/ML IJ SOLN
INTRAMUSCULAR | Status: AC
  Filled 2017-03-25: qty 1

## 2017-03-25 NOTE — Anesthesia Preprocedure Evaluation (Signed)
Anesthesia Evaluation  Patient identified by MRN, date of birth, ID band Patient awake    Reviewed: Allergy & Precautions, NPO status   Airway Mallampati: II       Dental  (+) Teeth Intact   Pulmonary neg pulmonary ROS,    Pulmonary exam normal        Cardiovascular Exercise Tolerance: Good  Rhythm:Regular     Neuro/Psych  Headaches, Depression    GI/Hepatic negative GI ROS, Neg liver ROS,   Endo/Other  Hypothyroidism   Renal/GU negative Renal ROS  negative genitourinary   Musculoskeletal negative musculoskeletal ROS (+)   Abdominal Normal abdominal exam  (+)   Peds negative pediatric ROS (+)  Hematology negative hematology ROS (+)   Anesthesia Other Findings   Reproductive/Obstetrics                             Anesthesia Physical Anesthesia Plan  ASA: I  Anesthesia Plan: General   Post-op Pain Management:    Induction: Intravenous  PONV Risk Score and Plan: 1 and Ondansetron  Airway Management Planned: Natural Airway  Additional Equipment:   Intra-op Plan:   Post-operative Plan:   Informed Consent: I have reviewed the patients History and Physical, chart, labs and discussed the procedure including the risks, benefits and alternatives for the proposed anesthesia with the patient or authorized representative who has indicated his/her understanding and acceptance.     Plan Discussed with: CRNA  Anesthesia Plan Comments:         Anesthesia Quick Evaluation

## 2017-03-25 NOTE — Op Note (Signed)
[facility] Patient Name: Kristina Barnes Procedure Date: 03/25/2017 7:12 AM MRN: [id number] Account #: [id number] Date of Birth: [birth year] Admit Type: Outpatient Age: 61 Room: [facility] ENDO ROOM 4 Gender: Female Note Status: Finalized Procedure:            Colonoscopy Indications:          Screening for colorectal malignant neoplasm Providers:            Lucilla Lame MD, MD Referring MD:         Deborra Medina, MD (Referring MD) Medicines:            Propofol per Anesthesia Complications:        No immediate complications. Procedure:            Pre-Anesthesia Assessment:                       - Prior to the procedure, a History and Physical was                        performed, and patient medications and allergies were                        reviewed. The patient's tolerance of previous                        anesthesia was also reviewed. The risks and benefits of                        the procedure and the sedation options and risks were                        discussed with the patient. All questions were                        answered, and informed consent was obtained. Prior                        Anticoagulants: The patient has taken no previous                        anticoagulant or antiplatelet agents. ASA Grade                        Assessment: II - A patient with mild systemic disease.                        After reviewing the risks and benefits, the patient was                        deemed in satisfactory condition to undergo the                        procedure.                       After obtaining informed consent, the colonoscope was                        passed under direct vision. Throughout the procedure,  the patient's blood pressure, pulse, and oxygen                        saturations were monitored continuously. The                        Colonoscope was introduced through the anus and          advanced to the the cecum, identified by appendiceal                        orifice and ileocecal valve. The colonoscopy was                        performed without difficulty. The patient tolerated the                        procedure well. The quality of the bowel preparation                        was excellent. Findings:      The perianal and digital rectal examinations were normal.      A 3 mm polyp was found in the ascending colon. The polyp was sessile.       The polyp was removed with a cold biopsy forceps. Resection and       retrieval were complete.      A 3 mm polyp was found in the descending colon. The polyp was sessile.       The polyp was removed with a cold biopsy forceps. Resection and       retrieval were complete.      Two sessile polyps were found in the sigmoid colon. The polyps were 3 to       4 mm in size. These polyps were removed with a cold biopsy forceps.       Resection and retrieval were complete.      Many small-mouthed diverticula were found in the sigmoid colon.      Non-bleeding internal hemorrhoids were found during retroflexion. The       hemorrhoids were Grade I (internal hemorrhoids that do not prolapse). Impression:           - One 3 mm polyp in the ascending colon, removed with a                        cold biopsy forceps. Resected and retrieved.                       - One 3 mm polyp in the descending colon, removed with                        a cold biopsy forceps. Resected and retrieved.                       - Two 3 to 4 mm polyps in the sigmoid colon, removed                        with a cold biopsy forceps. Resected and retrieved.                       - Diverticulosis in the  sigmoid colon.                       - Non-bleeding internal hemorrhoids. Recommendation:       - Discharge patient to home.                       - Resume previous diet.                       - Continue present medications.                       - Await  pathology results.                       - Repeat colonoscopy in 5 years if polyp adenoma and 10                        years if hyperplastic Procedure Code(s):    --- Professional ---                       [phone], Colonoscopy, flexible; with biopsy, single or                        multiple Diagnosis Code(s):    --- Professional ---                       Z12.11, Encounter for screening for malignant neoplasm                        of colon                       D12.2, Benign neoplasm of ascending colon                       D12.4, Benign neoplasm of descending colon                       D12.5, Benign neoplasm of sigmoid colon CPT copyright 2016 American Medical Association. All rights reserved. The codes documented in this report are preliminary and upon coder review may  be revised to meet current compliance requirements. Lucilla Lame MD, MD 03/25/2017 8:18:12 AM This report has been signed electronically. Number of Addenda: 0 Note Initiated On: 03/25/2017 7:12 AM Scope Withdrawal Time: 0 hours 13 minutes 21 seconds  Total Procedure Duration: 0 hours 17 minutes 45 seconds       [facility]

## 2017-03-25 NOTE — Anesthesia Postprocedure Evaluation (Signed)
Anesthesia Post Note  Patient: Kristina Barnes  Procedure(s) Performed: Procedure(s) (LRB): COLONOSCOPY WITH PROPOFOL (N/A)  Patient location during evaluation: PACU Anesthesia Type: General Level of consciousness: awake Pain management: pain level controlled Vital Signs Assessment: post-procedure vital signs reviewed and stable Respiratory status: nonlabored ventilation Cardiovascular status: stable Anesthetic complications: no     Last Vitals:  Vitals:   03/25/17 0850 03/25/17 0854  BP: 106/71   Pulse: 60 62  Resp: 12 10  Temp:      Last Pain:  Vitals:   03/25/17 0820  TempSrc: Tympanic                 VAN STAVEREN,Talayeh Bruinsma

## 2017-03-25 NOTE — Anesthesia Post-op Follow-up Note (Cosign Needed)
Anesthesia QCDR form completed.        

## 2017-03-25 NOTE — H&P (Signed)
Lucilla Lame, MD [address] [place], [place] [address] Phone: [phone] Fax : [phone]  Primary Care Physician:  Crecencio Mc, MD Primary Gastroenterologist:  Dr. Allen Norris  Pre-Procedure History & Physical: HPI:  Kristina Barnes is a 61 y.o. female is here for a screening colonoscopy.   Past Medical History:  Diagnosis Date  . Allergy   . Menopause syndrome   . Migraine y-41  . Sensory neuropathy   . Skin cancer 2015   basal cell  . Thyroid disease     Past Surgical History:  Procedure Laterality Date  . APPENDECTOMY      Prior to Admission medications   Medication Sig Start Date End Date Taking? Authorizing Provider  Cholecalciferol (VITAMIN D3) 1000 UNITS CAPS Take 1 capsule by mouth daily. Reported on 05/03/2016   Yes [provider]  diclofenac sodium (VOLTAREN) 1 % GEL Apply 2 g topically 4 (four) times daily as needed.   Yes [provider]  fish oil-omega-3 fatty acids 1000 MG capsule Take 1 g by mouth at bedtime. Reported on 05/03/2016   Yes [provider]  fluticasone (FLONASE) 50 MCG/ACT nasal spray Place 2 sprays into both nostrils daily. 08/17/16  Yes Crecencio Mc, MD  Ginger, Zingiber officinalis, (GINGER PO) Take 1 capsule by mouth 2 (two) times daily. Reported on 05/03/2016   Yes [provider]  levothyroxine (SYNTHROID, LEVOTHROID) 75 MCG tablet Take 1 tablet (75 mcg total) by mouth daily before breakfast. TAKE ONE TABLET BY MOUTH AT BEDTIME 07/13/14  Yes Crecencio Mc, MD  loratadine (CLARITIN) 10 MG tablet Take 1 tablet (10 mg total) by mouth at bedtime. 02/05/13  Yes Crecencio Mc, MD  Lysine 500 MG TABS Take 1 tablet (500 mg total) by mouth at bedtime. 02/05/13  Yes Crecencio Mc, MD  TURMERIC PO Take 1 capsule by mouth 2 (two) times daily.   Yes [provider]  buPROPion (WELLBUTRIN XL) 150 MG 24 hr tablet Take 1 tablet (150 mg total) by mouth daily. Patient not taking: Reported  on 01/13/2017 12/10/16   Crecencio Mc, MD  levothyroxine (SYNTHROID, LEVOTHROID) 75 MCG tablet TAKE 1 TABLET BY MOUTH DAILY BEFORE BREAKFAST Patient not taking: Reported on 01/13/2017 10/23/16   Crecencio Mc, MD  oseltamivir (TAMIFLU) 75 MG capsule Take 1 capsule (75 mg total) by mouth 2 (two) times daily. Patient not taking: Reported on 01/13/2017 11/10/16   Brunetta Jeans, PA-C    Allergies as of 01/17/2017 - Review Complete 01/13/2017  Allergen Reaction Noted  . Penicillins Rash 02/03/2013  . Sulfa antibiotics Rash 02/03/2013    Family History  Problem Relation Age of Onset  . Arthritis Mother        rheumatoid  . Hypertension Mother   . Ovarian cancer Mother 7  . Osteoporosis Mother   . Cancer Father   . Depression Sister   . Pulmonary embolism Sister   . Alcohol abuse Sister   . Alcohol abuse Brother   . Breast cancer Neg Hx     Social History   Social History  . Marital status: Married    Spouse name: N/A  . Number of children: N/A  . Years of education: N/A   Occupational History  . Not on file.   Social History Main Topics  . Smoking status: Never Smoker  . Smokeless tobacco: Never Used  . Alcohol use 3.5 oz/week    7 drink(s) per week  Comment: daily with dinner  . Drug use: No  . Sexual activity: Yes    Birth control/ protection: Post-menopausal   Other Topics Concern  . Not on file   Social History Narrative  . No narrative on file    Review of Systems: See HPI, otherwise negative ROS  Physical Exam: BP 111/77   Pulse 72   Temp 97.6 F (36.4 C) (Tympanic)   Resp 16   Ht 5\' 6"  (1.676 m)   Wt 140 lb (63.5 kg)   LMP 08/26/2012   SpO2 97%   BMI 22.60 kg/m  General:   Alert,  pleasant and cooperative in NAD Head:  Normocephalic and atraumatic. Neck:  Supple; no masses or thyromegaly. Lungs:  Clear throughout to auscultation.    Heart:  Regular rate and rhythm. Abdomen:  Soft, nontender and nondistended. Normal bowel sounds, without  guarding, and without rebound.   Neurologic:  Alert and  oriented x4;  grossly normal neurologically.  Impression/Plan: Kristina Barnes is now here to undergo a screening colonoscopy.  Risks, benefits, and alternatives regarding colonoscopy have been reviewed with the patient.  Questions have been answered.  All parties agreeable.

## 2017-03-25 NOTE — Transfer of Care (Signed)
Immediate Anesthesia Transfer of Care Note  Patient: Mairi A Kohli  Procedure(s) Performed: Procedure(s): COLONOSCOPY WITH PROPOFOL (N/A)  Patient Location: Endoscopy Unit  Anesthesia Type:General  Level of Consciousness: awake and alert   Airway & Oxygen Therapy: Patient Spontanous Breathing  Post-op Assessment: Report given to RN and Post -op Vital signs reviewed and stable  Post vital signs: Reviewed  Last Vitals:  Vitals:   03/25/17 0715 03/25/17 0818  BP: 111/77 (!) 88/60  Pulse: 72 67  Resp: 16 16  Temp: 36.4 C 36.2 C    Last Pain:  Vitals:   03/25/17 0715  TempSrc: Tympanic         Complications: No apparent anesthesia complications

## 2017-03-26 ENCOUNTER — Encounter: Payer: Self-pay | Admitting: Gastroenterology

## 2017-03-26 LAB — SURGICAL PATHOLOGY

## 2017-03-27 ENCOUNTER — Encounter: Payer: Self-pay | Admitting: Gastroenterology

## 2017-03-27 ENCOUNTER — Ambulatory Visit (INDEPENDENT_AMBULATORY_CARE_PROVIDER_SITE_OTHER): Payer: 59 | Admitting: *Deleted

## 2017-03-27 DIAGNOSIS — Z23 Encounter for immunization: Secondary | ICD-10-CM | POA: Diagnosis not present

## 2017-03-27 NOTE — Progress Notes (Signed)
Patient presented for TD vaccination , patient tolerated well with no concerns or any sign of distress.

## 2017-05-07 ENCOUNTER — Other Ambulatory Visit: Payer: Self-pay | Admitting: Internal Medicine

## 2017-05-12 DIAGNOSIS — L578 Other skin changes due to chronic exposure to nonionizing radiation: Secondary | ICD-10-CM | POA: Diagnosis not present

## 2017-05-12 DIAGNOSIS — L570 Actinic keratosis: Secondary | ICD-10-CM | POA: Diagnosis not present

## 2017-05-12 DIAGNOSIS — Z85828 Personal history of other malignant neoplasm of skin: Secondary | ICD-10-CM | POA: Diagnosis not present

## 2017-05-12 DIAGNOSIS — L821 Other seborrheic keratosis: Secondary | ICD-10-CM | POA: Diagnosis not present

## 2017-07-09 NOTE — Telephone Encounter (Signed)
Orders

## 2017-07-10 NOTE — Telephone Encounter (Signed)
Error

## 2017-07-21 ENCOUNTER — Encounter: Payer: Self-pay | Admitting: Internal Medicine

## 2017-08-20 ENCOUNTER — Encounter: Payer: Self-pay | Admitting: Internal Medicine

## 2017-09-30 DIAGNOSIS — H52223 Regular astigmatism, bilateral: Secondary | ICD-10-CM | POA: Diagnosis not present

## 2017-09-30 DIAGNOSIS — H5203 Hypermetropia, bilateral: Secondary | ICD-10-CM | POA: Diagnosis not present

## 2017-09-30 DIAGNOSIS — H524 Presbyopia: Secondary | ICD-10-CM | POA: Diagnosis not present

## 2017-10-01 DIAGNOSIS — D485 Neoplasm of uncertain behavior of skin: Secondary | ICD-10-CM | POA: Diagnosis not present

## 2017-10-01 DIAGNOSIS — L578 Other skin changes due to chronic exposure to nonionizing radiation: Secondary | ICD-10-CM | POA: Diagnosis not present

## 2017-10-01 DIAGNOSIS — L812 Freckles: Secondary | ICD-10-CM | POA: Diagnosis not present

## 2017-10-01 DIAGNOSIS — L821 Other seborrheic keratosis: Secondary | ICD-10-CM | POA: Diagnosis not present

## 2017-10-01 DIAGNOSIS — L719 Rosacea, unspecified: Secondary | ICD-10-CM | POA: Diagnosis not present

## 2017-10-01 DIAGNOSIS — Z1283 Encounter for screening for malignant neoplasm of skin: Secondary | ICD-10-CM | POA: Diagnosis not present

## 2017-10-01 DIAGNOSIS — D1800 Hemangioma unspecified site: Secondary | ICD-10-CM | POA: Diagnosis not present

## 2017-10-01 DIAGNOSIS — Z85828 Personal history of other malignant neoplasm of skin: Secondary | ICD-10-CM | POA: Diagnosis not present

## 2017-10-01 DIAGNOSIS — D225 Melanocytic nevi of trunk: Secondary | ICD-10-CM | POA: Diagnosis not present

## 2017-10-03 ENCOUNTER — Encounter: Payer: Self-pay | Admitting: Internal Medicine

## 2017-10-16 ENCOUNTER — Other Ambulatory Visit: Payer: Self-pay | Admitting: Internal Medicine

## 2017-11-10 ENCOUNTER — Other Ambulatory Visit: Payer: Self-pay | Admitting: Internal Medicine

## 2017-11-17 ENCOUNTER — Other Ambulatory Visit: Payer: Self-pay | Admitting: Internal Medicine

## 2017-11-17 DIAGNOSIS — Z1231 Encounter for screening mammogram for malignant neoplasm of breast: Secondary | ICD-10-CM

## 2017-11-24 ENCOUNTER — Other Ambulatory Visit: Payer: Self-pay | Admitting: Internal Medicine

## 2017-12-05 ENCOUNTER — Ambulatory Visit
Admission: RE | Admit: 2017-12-05 | Discharge: 2017-12-05 | Disposition: A | Discharge status: Home / Self Care | Payer: No Typology Code available for payment source | Source: Ambulatory Visit | Attending: Internal Medicine | Admitting: Internal Medicine

## 2017-12-05 DIAGNOSIS — Z1231 Encounter for screening mammogram for malignant neoplasm of breast: Secondary | ICD-10-CM | POA: Diagnosis not present

## 2018-03-25 ENCOUNTER — Ambulatory Visit (INDEPENDENT_AMBULATORY_CARE_PROVIDER_SITE_OTHER): Payer: No Typology Code available for payment source | Admitting: Internal Medicine

## 2018-03-25 ENCOUNTER — Other Ambulatory Visit (HOSPITAL_COMMUNITY)
Admission: RE | Admit: 2018-03-25 | Discharge: 2018-03-25 | Disposition: A | Discharge status: Home / Self Care | Payer: No Typology Code available for payment source | Source: Ambulatory Visit | Attending: Internal Medicine | Admitting: Internal Medicine

## 2018-03-25 ENCOUNTER — Encounter: Payer: Self-pay | Admitting: Internal Medicine

## 2018-03-25 VITALS — BP 106/70 | HR 62 | Temp 98.2°F | Resp 14 | Ht 66.0 in | Wt 146.8 lb

## 2018-03-25 DIAGNOSIS — G2581 Restless legs syndrome: Secondary | ICD-10-CM | POA: Insufficient documentation

## 2018-03-25 DIAGNOSIS — E785 Hyperlipidemia, unspecified: Secondary | ICD-10-CM | POA: Insufficient documentation

## 2018-03-25 DIAGNOSIS — Z7951 Long term (current) use of inhaled steroids: Secondary | ICD-10-CM | POA: Diagnosis not present

## 2018-03-25 DIAGNOSIS — E039 Hypothyroidism, unspecified: Secondary | ICD-10-CM | POA: Diagnosis not present

## 2018-03-25 DIAGNOSIS — Z79899 Other long term (current) drug therapy: Secondary | ICD-10-CM | POA: Diagnosis not present

## 2018-03-25 DIAGNOSIS — Z124 Encounter for screening for malignant neoplasm of cervix: Secondary | ICD-10-CM | POA: Insufficient documentation

## 2018-03-25 DIAGNOSIS — Z0000 Encounter for general adult medical examination without abnormal findings: Secondary | ICD-10-CM

## 2018-03-25 DIAGNOSIS — E559 Vitamin D deficiency, unspecified: Secondary | ICD-10-CM

## 2018-03-25 DIAGNOSIS — Z79890 Hormone replacement therapy (postmenopausal): Secondary | ICD-10-CM | POA: Diagnosis not present

## 2018-03-25 LAB — LIPID PANEL
CHOLESTEROL: 217 mg/dL — AB (ref 0–200)
HDL: 93.5 mg/dL (ref >39.00)
LDL Cholesterol: 112 mg/dL — ABNORMAL HIGH (ref 0–99)
NonHDL: 123.5
Total CHOL/HDL Ratio: 2
Triglycerides: 60 mg/dL (ref 0.0–149.0)
VLDL: 12 mg/dL (ref 0.0–40.0)

## 2018-03-25 LAB — CBC WITH DIFFERENTIAL/PLATELET
BASOS PCT: 0.7 % (ref 0.0–3.0)
Basophils Absolute: 0 10*3/uL (ref 0.0–0.1)
EOS ABS: 0.2 10*3/uL (ref 0.0–0.7)
Eosinophils Relative: 3.6 % (ref 0.0–5.0)
HCT: 40.3 % (ref 36.0–46.0)
HEMOGLOBIN: 13.7 g/dL (ref 12.0–15.0)
Lymphocytes Relative: 40.4 % (ref 12.0–46.0)
Lymphs Abs: 1.8 10*3/uL (ref 0.7–4.0)
MCHC: 33.9 g/dL (ref 30.0–36.0)
MCV: 95.5 fl (ref 78.0–100.0)
MONO ABS: 0.5 10*3/uL (ref 0.1–1.0)
Monocytes Relative: 11.9 % (ref 3.0–12.0)
Neutro Abs: 1.9 10*3/uL (ref 1.4–7.7)
Neutrophils Relative %: 43.4 % (ref 43.0–77.0)
Platelets: 246 10*3/uL (ref 150.0–400.0)
RBC: 4.22 Mil/uL (ref 3.87–5.11)
RDW: 13.2 % (ref 11.5–15.5)
WBC: 4.4 10*3/uL (ref 4.0–10.5)

## 2018-03-25 LAB — COMPREHENSIVE METABOLIC PANEL
ALBUMIN: 4.5 g/dL (ref 3.5–5.2)
ALK PHOS: 59 U/L (ref 39–117)
ALT: 15 U/L (ref 0–35)
AST: 17 U/L (ref 0–37)
BILIRUBIN TOTAL: 0.5 mg/dL (ref 0.2–1.2)
BUN: 15 mg/dL (ref 6–23)
CALCIUM: 9.4 mg/dL (ref 8.4–10.5)
CO2: 28 mEq/L (ref 19–32)
CREATININE: 0.7 mg/dL (ref 0.40–1.20)
Chloride: 104 mEq/L (ref 96–112)
GFR: 90.39 mL/min (ref >60.00)
Glucose, Bld: 100 mg/dL — ABNORMAL HIGH (ref 70–99)
Potassium: 4.2 mEq/L (ref 3.5–5.1)
Sodium: 140 mEq/L (ref 135–145)
Total Protein: 7 g/dL (ref 6.0–8.3)

## 2018-03-25 LAB — IRON,TIBC AND FERRITIN PANEL
%SAT: 48 % (calc) (ref 11–50)
Ferritin: 62 ng/mL (ref 20–288)
IRON: 150 ug/dL (ref 45–160)
TIBC: 315 mcg/dL (calc) (ref 250–450)

## 2018-03-25 LAB — TSH: TSH: 4.5 u[IU]/mL (ref 0.35–4.50)

## 2018-03-25 LAB — VITAMIN D 25 HYDROXY (VIT D DEFICIENCY, FRACTURES): VITD: 36.12 ng/mL (ref 30.00–100.00)

## 2018-03-25 MED ORDER — FLUTICASONE PROPIONATE 50 MCG/ACT NA SUSP: 2.0000 | Freq: Every day | NASAL | 3 refills | Status: DC

## 2018-03-25 NOTE — Patient Instructions (Signed)
Health Maintenance for Postmenopausal Women Menopause is a normal process in which your reproductive ability comes to an end. This process happens gradually over a span of months to years, usually between the ages of 22 and 9. Menopause is complete when you have missed 12 consecutive menstrual periods. It is important to talk with your health care provider about some of the most common conditions that affect postmenopausal women, such as heart disease, cancer, and bone loss (osteoporosis). Adopting a healthy lifestyle and getting preventive care can help to promote your health and wellness. Those actions can also lower your chances of developing some of these common conditions. What should I know about menopause? During menopause, you may experience a number of symptoms, such as:  Moderate-to-severe hot flashes.  Night sweats.  Decrease in sex drive.  Mood swings.  Headaches.  Tiredness.  Irritability.  Memory problems.  Insomnia.  Choosing to treat or not to treat menopausal changes is an individual decision that you make with your health care provider. What should I know about hormone replacement therapy and supplements? Hormone therapy products are effective for treating symptoms that are associated with menopause, such as hot flashes and night sweats. Hormone replacement carries certain risks, especially as you become older. If you are thinking about using estrogen or estrogen with progestin treatments, discuss the benefits and risks with your health care provider. What should I know about heart disease and stroke? Heart disease, heart attack, and stroke become more likely as you age. This may be due, in part, to the hormonal changes that your body experiences during menopause. These can affect how your body processes dietary fats, triglycerides, and cholesterol. Heart attack and stroke are both medical emergencies. There are many things that you can do to help prevent heart disease  and stroke:  Have your blood pressure checked at least every 1-2 years. High blood pressure causes heart disease and increases the risk of stroke.  If you are 53-22 years old, ask your health care provider if you should take aspirin to prevent a heart attack or a stroke.  Do not use any tobacco products, including cigarettes, chewing tobacco, or electronic cigarettes. If you need help quitting, ask your health care provider.  It is important to eat a healthy diet and maintain a healthy weight. ? Be sure to include plenty of vegetables, fruits, low-fat dairy products, and lean protein. ? Avoid eating foods that are high in solid fats, added sugars, or salt (sodium).  Get regular exercise. This is one of the most important things that you can do for your health. ? Try to exercise for at least 150 minutes each week. The type of exercise that you do should increase your heart rate and make you sweat. This is known as moderate-intensity exercise. ? Try to do strengthening exercises at least twice each week. Do these in addition to the moderate-intensity exercise.  Know your numbers.Ask your health care provider to check your cholesterol and your blood glucose. Continue to have your blood tested as directed by your health care provider.  What should I know about cancer screening? There are several types of cancer. Take the following steps to reduce your risk and to catch any cancer development as early as possible. Breast Cancer  Practice breast self-awareness. ? This means understanding how your breasts normally appear and feel. ? It also means doing regular breast self-exams. Let your health care provider know about any changes, no matter how small.  If you are 40  or older, have a clinician do a breast exam (clinical breast exam or CBE) every year. Depending on your age, family history, and medical history, it may be recommended that you also have a yearly breast X-ray (mammogram).  If you  have a family history of breast cancer, talk with your health care provider about genetic screening.  If you are at high risk for breast cancer, talk with your health care provider about having an MRI and a mammogram every year.  Breast cancer (BRCA) gene test is recommended for women who have family members with BRCA-related cancers. Results of the assessment will determine the need for genetic counseling and BRCA1 and for BRCA2 testing. BRCA-related cancers include these types: ? Breast. This occurs in males or females. ? Ovarian. ? Tubal. This may also be called fallopian tube cancer. ? Cancer of the abdominal or pelvic lining (peritoneal cancer). ? Prostate. ? Pancreatic.  Cervical, Uterine, and Ovarian Cancer Your health care provider may recommend that you be screened regularly for cancer of the pelvic organs. These include your ovaries, uterus, and vagina. This screening involves a pelvic exam, which includes checking for microscopic changes to the surface of your cervix (Pap test).  For women ages 21-65, health care providers may recommend a pelvic exam and a Pap test every three years. For women ages 79-65, they may recommend the Pap test and pelvic exam, combined with testing for human papilloma virus (HPV), every five years. Some types of HPV increase your risk of cervical cancer. Testing for HPV may also be done on women of any age who have unclear Pap test results.  Other health care providers may not recommend any screening for nonpregnant women who are considered low risk for pelvic cancer and have no symptoms. Ask your health care provider if a screening pelvic exam is right for you.  If you have had past treatment for cervical cancer or a condition that could lead to cancer, you need Pap tests and screening for cancer for at least 20 years after your treatment. If Pap tests have been discontinued for you, your risk factors (such as having a new sexual partner) need to be  reassessed to determine if you should start having screenings again. Some women have medical problems that increase the chance of getting cervical cancer. In these cases, your health care provider may recommend that you have screening and Pap tests more often.  If you have a family history of uterine cancer or ovarian cancer, talk with your health care provider about genetic screening.  If you have vaginal bleeding after reaching menopause, tell your health care provider.  There are currently no reliable tests available to screen for ovarian cancer.  Lung Cancer Lung cancer screening is recommended for adults 69-62 years old who are at high risk for lung cancer because of a history of smoking. A yearly low-dose CT scan of the lungs is recommended if you:  Currently smoke.  Have a history of at least 30 pack-years of smoking and you currently smoke or have quit within the past 15 years. A pack-year is smoking an average of one pack of cigarettes per day for one year.  Yearly screening should:  Continue until it has been 15 years since you quit.  Stop if you develop a health problem that would prevent you from having lung cancer treatment.  Colorectal Cancer  This type of cancer can be detected and can often be prevented.  Routine colorectal cancer screening usually begins at  age 42 and continues through age 45.  If you have risk factors for colon cancer, your health care provider may recommend that you be screened at an earlier age.  If you have a family history of colorectal cancer, talk with your health care provider about genetic screening.  Your health care provider may also recommend using home test kits to check for hidden blood in your stool.  A small camera at the end of a tube can be used to examine your colon directly (sigmoidoscopy or colonoscopy). This is done to check for the earliest forms of colorectal cancer.  Direct examination of the colon should be repeated every  5-10 years until age 71. However, if early forms of precancerous polyps or small growths are found or if you have a family history or genetic risk for colorectal cancer, you may need to be screened more often.  Skin Cancer  Check your skin from head to toe regularly.  Monitor any moles. Be sure to tell your health care provider: ? About any new moles or changes in moles, especially if there is a change in a mole's shape or color. ? If you have a mole that is larger than the size of a pencil eraser.  If any of your family members has a history of skin cancer, especially at a young age, talk with your health care provider about genetic screening.  Always use sunscreen. Apply sunscreen liberally and repeatedly throughout the day.  Whenever you are outside, protect yourself by wearing long sleeves, pants, a wide-brimmed hat, and sunglasses.  What should I know about osteoporosis? Osteoporosis is a condition in which bone destruction happens more quickly than new bone creation. After menopause, you may be at an increased risk for osteoporosis. To help prevent osteoporosis or the bone fractures that can happen because of osteoporosis, the following is recommended:  If you are 46-71 years old, get at least 1,000 mg of calcium and at least 600 mg of vitamin D per day.  If you are older than age 55 but younger than age 65, get at least 1,200 mg of calcium and at least 600 mg of vitamin D per day.  If you are older than age 54, get at least 1,200 mg of calcium and at least 800 mg of vitamin D per day.  Smoking and excessive alcohol intake increase the risk of osteoporosis. Eat foods that are rich in calcium and vitamin D, and do weight-bearing exercises several times each week as directed by your health care provider. What should I know about how menopause affects my mental health? Depression may occur at any age, but it is more common as you become older. Common symptoms of depression  include:  Low or sad mood.  Changes in sleep patterns.  Changes in appetite or eating patterns.  Feeling an overall lack of motivation or enjoyment of activities that you previously enjoyed.  Frequent crying spells.  Talk with your health care provider if you think that you are experiencing depression. What should I know about immunizations? It is important that you get and maintain your immunizations. These include:  Tetanus, diphtheria, and pertussis (Tdap) booster vaccine.  Influenza every year before the flu season begins.  Pneumonia vaccine.  Shingles vaccine.  Your health care provider may also recommend other immunizations. This information is not intended to replace advice given to you by your health care provider. Make sure you discuss any questions you have with your health care provider. Document Released: 11/22/2005  Document Revised: 04/19/2016 Document Reviewed: 07/04/2015 [employer] Interactive Patient Education  2018 [employer]  

## 2018-03-25 NOTE — Progress Notes (Signed)
Patient ID: Kristina Barnes, female    DOB: 20-Feb-1956  Age: 63 y.o. MRN: [id number]  The patient is here for annual preventive  examination and management of other chronic and acute problems.  Due for PAP SMEAR Mammogram normal 2/19 Colonoscopy  2018 5 yr fu  RLS resolved without intervention.   Taking a gummy MVI  2000 Ius D3   using a mouthguard for bruxism    The risk factors are reflected in the social history.  The roster of all physicians providing medical care to patient - is listed in the Snapshot section of the chart.  Activities of daily living:  The patient is 100% independent in all ADLs: dressing, toileting, feeding as well as independent mobility  Home safety : The patient has smoke detectors in the home. They wear seatbelts.  There are no firearms at home. There is no violence in the home.   There is no risks for hepatitis, STDs or HIV. There is no   history of blood transfusion. They have no travel history to infectious disease endemic areas of the world.  The patient has seen their dentist in the last six month. They have seen their eye doctor in the last year. They admit to slight hearing difficulty with regard to whispered voices and some television programs.  They have deferred audiologic testing in the last year.  They do not  have excessive sun exposure. Discussed the need for sun protection: hats, long sleeves and use of sunscreen if there is significant sun exposure.   Diet: the importance of a healthy diet is discussed. They do have a healthy diet.  The benefits of regular aerobic exercise were discussed. She exercise s s4 to 5 days per week,  Running for 20   20 minutes.   Depression screen: there are no signs or vegative symptoms of depression- irritability, change in appetite, anhedonia, sadness/tearfullness.  Cognitive assessment: the patient manages all their financial and personal affairs and is actively engaged. They could relate day,date,year and  events; recalled 2/3 objects at 3 minutes; performed clock-face test normally.  The following portions of the patient's history were reviewed and updated as appropriate: allergies, current medications, past family history, past medical history,  past surgical history, past social history  and problem list.  Visual acuity was not assessed per patient preference since she has regular follow up with her ophthalmologist. Hearing and body mass index were assessed and reviewed.   During the course of the visit the patient was educated and counseled about appropriate screening and preventive services including : fall prevention , diabetes screening, nutrition counseling, colorectal cancer screening, and recommended immunizations.    CC: The primary encounter diagnosis was Encounter for screening for cervical cancer . Diagnoses of Vitamin D deficiency, Acquired hypothyroidism, Restless leg, Hyperlipidemia LDL goal <130, Encounter for preventive health examination, and Restless legs were also pertinent to this visit.  History Kristina Barnes has a past medical history of Allergy, Menopause syndrome, Migraine (y-41), Sensory neuropathy, Skin cancer (2015), and Thyroid disease.   She has a past surgical history that includes Appendectomy and Colonoscopy with propofol (N/A, 03/25/2017).   Her family history includes Alcohol abuse in her brother and sister; Arthritis in her mother; Cancer in her father; Depression in her sister; Hypertension in her mother; Osteoporosis in her mother; Ovarian cancer (age of onset: 78) in her mother; Pulmonary embolism in her sister.She reports that she has never smoked. She has never used smokeless tobacco. She reports that she drinks  about 4.2 oz of alcohol per week. She reports that she does not use drugs.  Outpatient Medications Prior to Visit  Medication Sig Dispense Refill  . Cholecalciferol (VITAMIN D3) 1000 UNITS CAPS Take 1 capsule by mouth daily. Reported on 05/03/2016    .  diclofenac sodium (VOLTAREN) 1 % GEL Apply 2 g topically 4 (four) times daily as needed.    . fish oil-omega-3 fatty acids 1000 MG capsule Take 1 g by mouth at bedtime. Reported on 05/03/2016    . loratadine (CLARITIN) 10 MG tablet Take 1 tablet (10 mg total) by mouth at bedtime. 90 tablet 3  . Lysine 500 MG TABS Take 1 tablet (500 mg total) by mouth at bedtime. 90 tablet 3  . TURMERIC PO Take 1 capsule by mouth 2 (two) times daily.    . fluticasone (FLONASE) 50 MCG/ACT nasal spray Place 2 sprays into both nostrils daily. Needs appt for further refills 16 g 0  . levothyroxine (SYNTHROID, LEVOTHROID) 75 MCG tablet TAKE 1 TABLET BY MOUTH DAILY BEFORE BREAKFAST 90 tablet 0  . buPROPion (WELLBUTRIN XL) 150 MG 24 hr tablet Take 1 tablet (150 mg total) by mouth daily. (Patient not taking: Reported on 01/13/2017) 30 tablet 3  . Ginger, Zingiber officinalis, (GINGER PO) Take 1 capsule by mouth 2 (two) times daily. Reported on 05/03/2016    . levothyroxine (SYNTHROID, LEVOTHROID) 75 MCG tablet Take 1 tablet (75 mcg total) by mouth daily before breakfast. TAKE ONE TABLET BY MOUTH AT BEDTIME (Patient not taking: Reported on 03/25/2018) 90 tablet 1  . oseltamivir (TAMIFLU) 75 MG capsule Take 1 capsule (75 mg total) by mouth 2 (two) times daily. (Patient not taking: Reported on 01/13/2017) 10 capsule 0   No facility-administered medications prior to visit.     Review of Systems   Patient denies headache, fevers, malaise, unintentional weight loss, skin rash, eye pain, sinus congestion and sinus pain, sore throat, dysphagia,  hemoptysis , cough, dyspnea, wheezing, chest pain, palpitations, orthopnea, edema, abdominal pain, nausea, melena, diarrhea, constipation, flank pain, dysuria, hematuria, urinary  Frequency, nocturia, numbness, tingling, seizures,  Focal weakness, Loss of consciousness,  Tremor, insomnia, depression, anxiety, and suicidal ideation.      Objective:  BP 106/70 (BP Location: Left Arm, Patient  Position: Sitting, Cuff Size: Normal)   Pulse 62   Temp 98.2 F (36.8 C) (Oral)   Resp 14   Ht 5\' 6"  (1.676 m)   Wt 146 lb 12.8 oz (66.6 kg)   LMP 08/26/2012   SpO2 95%   BMI 23.69 kg/m   Physical Exam   General Appearance:    Alert, cooperative, no distress, appears stated age  Head:    Normocephalic, without obvious abnormality, atraumatic  Eyes:    PERRL, conjunctiva/corneas clear, EOM's intact, fundi    benign, both eyes  Ears:    Normal TM's and external ear canals, both ears  Nose:   Nares normal, septum midline, mucosa normal, no drainage    or sinus tenderness  Throat:   Lips, mucosa, and tongue normal; teeth and gums normal  Neck:   Supple, symmetrical, trachea midline, no adenopathy;    thyroid:  no enlargement/tenderness/nodules; no carotid   bruit or JVD  Back:     Symmetric, no curvature, ROM normal, no CVA tenderness  Lungs:     Clear to auscultation bilaterally, respirations unlabored  Chest Wall:    No tenderness or deformity   Heart:    Regular rate and rhythm, S1 and  S2 normal, no murmur, rub   or gallop  Breast Exam:    No tenderness, masses, or nipple abnormality  Abdomen:     Soft, non-tender, bowel sounds active all four quadrants,    no masses, no organomegaly  Genitalia:    Pelvic: cervix normal in appearance, external genitalia normal, no adnexal masses or tenderness, no cervical motion tenderness, rectovaginal septum normal, uterus normal size, shape, and consistency and vagina normal without discharge  Extremities:   Extremities normal, atraumatic, no cyanosis or edema  Pulses:   2+ and symmetric all extremities  Skin:   Skin color, texture, turgor normal, no rashes or lesions  Lymph nodes:   Cervical, supraclavicular, and axillary nodes normal  Neurologic:   CNII-XII intact, normal strength, sensation and reflexes    throughout     Assessment & Plan:   Problem List Items Addressed This Visit    Vitamin D deficiency    Managed with oral daily  supplements.  Repeat level is norma.      Relevant Orders   VITAMIN D 25 Hydroxy (Vit-D Deficiency, Fractures) (Completed)   Restless legs    Symptoms have resolved; iron  and CBC are normal       Hypothyroidism    Thyroid function is upper limits of normal on current dose,  But she has missed a week of medication. .  No current changes needed.    Lab Results  Component Value Date   TSH 4.50 03/25/2018          Relevant Medications   levothyroxine (SYNTHROID, LEVOTHROID) 75 MCG tablet   Other Relevant Orders   TSH (Completed)   Encounter for preventive health examination    Annual comprehensive preventive exam was done as well as an evaluation and management of chronic conditions .  During the course of the visit the patient was educated and counseled about appropriate screening and preventive services including :  diabetes screening, lipid analysis with projected  10 year  risk for CAD , nutrition counseling, breast, cervical and colorectal cancer screening, and recommended immunizations.  Printed recommendations for health maintenance screenings was give       Other Visit Diagnoses    Encounter for screening for cervical cancer     -  Primary   Relevant Orders   Cytology - PAP (Completed)   Restless leg       Relevant Orders   Iron, TIBC and Ferritin Panel (Completed)   CBC with Differential/Platelet (Completed)   Hyperlipidemia LDL goal <130       Relevant Orders   Comprehensive metabolic panel (Completed)   Lipid panel (Completed)      I have discontinued Ashlen A. Bangerter's (Ginger, Zingiber officinalis, (GINGER PO)), oseltamivir, and buPROPion. I am also having her maintain her fish oil-omega-3 fatty acids, Lysine, loratadine, Vitamin D3, diclofenac sodium, TURMERIC PO, fluticasone, and levothyroxine.  Meds ordered this encounter  Medications  . fluticasone (FLONASE) 50 MCG/ACT nasal spray    Sig: Place 2 sprays into both nostrils daily. Needs appt for  further refills    Dispense:  48 g    Refill:  3    PLEASE ADJUST DOSE TO 90 DAY SUPPLY ( 3 BOTTLES)  . levothyroxine (SYNTHROID, LEVOTHROID) 75 MCG tablet    Sig: TAKE 1 TABLET BY MOUTH DAILY BEFORE BREAKFAST    Dispense:  90 tablet    Refill:  2    Medications Discontinued During This Encounter  Medication Reason  . buPROPion (WELLBUTRIN XL) 150  MG 24 hr tablet Patient has not taken in last 30 days  . Ginger, Zingiber officinalis, (GINGER PO) Patient has not taken in last 30 days  . levothyroxine (SYNTHROID, LEVOTHROID) 75 MCG tablet Duplicate  . oseltamivir (TAMIFLU) 75 MG capsule Completed Course  . fluticasone (FLONASE) 50 MCG/ACT nasal spray Reorder  . levothyroxine (SYNTHROID, LEVOTHROID) 75 MCG tablet Reorder    Follow-up: Return in about 1 year (around 03/26/2019) for cpE.   Crecencio Mc, MD

## 2018-03-26 LAB — CYTOLOGY - PAP
Bacterial vaginitis: NEGATIVE
Candida vaginitis: NEGATIVE
DIAGNOSIS: NEGATIVE
HPV: NOT DETECTED
Trichomonas: NEGATIVE

## 2018-03-27 DIAGNOSIS — G2581 Restless legs syndrome: Secondary | ICD-10-CM | POA: Insufficient documentation

## 2018-03-27 MED ORDER — LEVOTHYROXINE SODIUM 75 MCG PO TABS: ORAL_TABLET | ORAL | 2 refills | Status: DC

## 2018-03-27 NOTE — Assessment & Plan Note (Signed)
Symptoms have resolved; iron  and CBC are normal

## 2018-03-27 NOTE — Assessment & Plan Note (Signed)
Thyroid function is upper limits of normal on current dose,  But she has missed a week of medication. .  No current changes needed.    Lab Results  Component Value Date   TSH 4.50 03/25/2018

## 2018-03-27 NOTE — Assessment & Plan Note (Signed)
Managed with oral daily supplements.  Repeat level is norma.

## 2018-03-27 NOTE — Assessment & Plan Note (Signed)
Annual comprehensive preventive exam was done as well as an evaluation and management of chronic conditions .  During the course of the visit the patient was educated and counseled about appropriate screening and preventive services including :  diabetes screening, lipid analysis with projected  10 year  risk for CAD , nutrition counseling, breast, cervical and colorectal cancer screening, and recommended immunizations.  Printed recommendations for health maintenance screenings was give 

## 2018-06-29 DIAGNOSIS — E039 Hypothyroidism, unspecified: Secondary | ICD-10-CM

## 2018-06-30 MED ORDER — LEVOTHYROXINE SODIUM 88 MCG PO TABS: ORAL_TABLET | ORAL | 1 refills | Status: DC

## 2018-09-08 ENCOUNTER — Other Ambulatory Visit: Payer: Self-pay

## 2018-09-09 ENCOUNTER — Other Ambulatory Visit (INDEPENDENT_AMBULATORY_CARE_PROVIDER_SITE_OTHER): Payer: No Typology Code available for payment source

## 2018-09-09 DIAGNOSIS — E039 Hypothyroidism, unspecified: Secondary | ICD-10-CM | POA: Diagnosis not present

## 2018-09-09 LAB — TSH: TSH: 0.16 u[IU]/mL — ABNORMAL LOW (ref 0.35–4.50)

## 2018-09-10 ENCOUNTER — Other Ambulatory Visit: Payer: Self-pay | Admitting: Internal Medicine

## 2018-09-10 DIAGNOSIS — E039 Hypothyroidism, unspecified: Secondary | ICD-10-CM

## 2018-09-10 MED ORDER — LEVOTHYROXINE SODIUM 75 MCG PO TABS: 75.0000 ug | ORAL_TABLET | Freq: Every day | ORAL | 1 refills | Status: DC

## 2018-09-10 NOTE — Assessment & Plan Note (Signed)
Dose reduced to 75 mcg Nov 28 Lab Results  Component Value Date   TSH 0.16 (L) 09/09/2018

## 2018-10-09 ENCOUNTER — Ambulatory Visit: Payer: Self-pay | Admitting: Internal Medicine

## 2018-10-09 ENCOUNTER — Encounter

## 2018-11-02 DIAGNOSIS — L578 Other skin changes due to chronic exposure to nonionizing radiation: Secondary | ICD-10-CM | POA: Diagnosis not present

## 2018-11-02 DIAGNOSIS — D1800 Hemangioma unspecified site: Secondary | ICD-10-CM | POA: Diagnosis not present

## 2018-11-02 DIAGNOSIS — Z85828 Personal history of other malignant neoplasm of skin: Secondary | ICD-10-CM | POA: Diagnosis not present

## 2018-11-02 DIAGNOSIS — L918 Other hypertrophic disorders of the skin: Secondary | ICD-10-CM | POA: Diagnosis not present

## 2018-11-02 DIAGNOSIS — L821 Other seborrheic keratosis: Secondary | ICD-10-CM | POA: Diagnosis not present

## 2018-11-02 DIAGNOSIS — L570 Actinic keratosis: Secondary | ICD-10-CM | POA: Diagnosis not present

## 2018-11-02 DIAGNOSIS — D485 Neoplasm of uncertain behavior of skin: Secondary | ICD-10-CM | POA: Diagnosis not present

## 2018-11-02 DIAGNOSIS — Z1283 Encounter for screening for malignant neoplasm of skin: Secondary | ICD-10-CM | POA: Diagnosis not present

## 2018-11-02 DIAGNOSIS — L812 Freckles: Secondary | ICD-10-CM | POA: Diagnosis not present

## 2019-01-21 MED FILL — FLUTICASONE PROP 50 MCG SPR: 50 | 90 days supply | Qty: 48 | Fill #0

## 2019-01-21 MED FILL — FLUoxetine HCL 10 MG CAPS: 10 | 30 days supply | Qty: 90 | Fill #0

## 2019-04-13 ENCOUNTER — Other Ambulatory Visit: Payer: Self-pay | Admitting: Internal Medicine

## 2019-04-13 DIAGNOSIS — Z1231 Encounter for screening mammogram for malignant neoplasm of breast: Secondary | ICD-10-CM

## 2019-04-23 ENCOUNTER — Encounter: Payer: Self-pay | Admitting: Internal Medicine

## 2019-04-23 ENCOUNTER — Other Ambulatory Visit: Payer: Self-pay

## 2019-04-23 ENCOUNTER — Ambulatory Visit (INDEPENDENT_AMBULATORY_CARE_PROVIDER_SITE_OTHER): Payer: 59 | Admitting: Internal Medicine

## 2019-04-23 VITALS — BP 104/72 | HR 61 | Temp 97.9°F | Resp 15 | Ht 66.0 in | Wt 137.1 lb

## 2019-04-23 DIAGNOSIS — F320 Major depressive disorder, single episode, mild: Secondary | ICD-10-CM | POA: Diagnosis not present

## 2019-04-23 DIAGNOSIS — G2581 Restless legs syndrome: Secondary | ICD-10-CM | POA: Diagnosis not present

## 2019-04-23 DIAGNOSIS — Z0000 Encounter for general adult medical examination without abnormal findings: Secondary | ICD-10-CM | POA: Diagnosis not present

## 2019-04-23 DIAGNOSIS — E559 Vitamin D deficiency, unspecified: Secondary | ICD-10-CM

## 2019-04-23 DIAGNOSIS — Z1159 Encounter for screening for other viral diseases: Secondary | ICD-10-CM

## 2019-04-23 DIAGNOSIS — E039 Hypothyroidism, unspecified: Secondary | ICD-10-CM

## 2019-04-23 NOTE — Progress Notes (Signed)
Patient ID: Kristina Barnes, female    DOB: [birth year]  Age: 63 y.o. MRN: [id number]  The patient is here for annual preventive  examination and management of other chronic and acute problems.   The risk factors are reflected in the social history.  The roster of all physicians providing medical care to patient - is listed in the Snapshot section of the chart.  Activities of daily living:  The patient is 100% independent in all ADLs: dressing, toileting, feeding as well as independent mobility  Home safety : The patient has smoke detectors in the home. They wear seatbelts.  There are no firearms at home. There is no violence in the home.   There is no risks for hepatitis, STDs or HIV. There is no   history of blood transfusion. They have no travel history to infectious disease endemic areas of the world.  The patient has seen their dentist in the last six month. They have seen their eye doctor in the last year. She denies  hearing difficulty with regard to whispered voices and some television programs.  They have deferred audiologic testing in the last year.  They do not  have excessive sun exposure. Discussed the need for sun protection: hats, long sleeves and use of sunscreen if there is significant sun exposure.   Diet: the importance of a healthy diet is discussed. They do have a healthy diet.  The benefits of regular aerobic exercise were discussed. She exercises 4 times per week ,  60 minutes.   Depression screen: there are no signs or vegative symptoms of depression- irritability, change in appetite, anhedonia, sadness/tearfullness.  The following portions of the patient's history were reviewed and updated as appropriate: allergies, current medications, past family history, past medical history,  past surgical history, past social history  and problem list.  Visual acuity was not assessed per patient preference since she has regular follow up with her ophthalmologist. Hearing and body  mass index were assessed and reviewed.   During the course of the visit the patient was educated and counseled about appropriate screening and preventive services including : fall prevention , diabetes screening, nutrition counseling, colorectal cancer screening, and recommended immunizations.    CC: The primary encounter diagnosis was Encounter for hepatitis C screening test for low risk patient. Diagnoses of Vitamin D deficiency, Restless legs, Acquired hypothyroidism, Encounter for preventive health examination, and Depression, major, single episode, mild ([place]) were also pertinent to this visit.  She has no acute issues.  Her depression is well managed  History Kristina Barnes has a past medical history of Allergy, Menopause syndrome, Migraine (y-41), Sensory neuropathy, Skin cancer (2015), and Thyroid disease.   She has a past surgical history that includes Appendectomy and Colonoscopy with propofol (N/A, 03/25/2017).   Her family history includes Alcohol abuse in her brother and sister; Arthritis in her mother; Cancer in her father; Depression in her sister; Hypertension in her mother; Osteoporosis in her mother; Ovarian cancer (age of onset: 69) in her mother; Pulmonary embolism in her sister.She reports that she has never smoked. She has never used smokeless tobacco. She reports current alcohol use of about 7.0 standard drinks of alcohol per week. She reports that she does not use drugs.  Outpatient Medications Prior to Visit  Medication Sig Dispense Refill  . Cholecalciferol (VITAMIN D3) 1000 UNITS CAPS Take 1 capsule by mouth daily. Reported on 05/03/2016    . fish oil-omega-3 fatty acids 1000 MG capsule Take 1 g by mouth at  bedtime. Reported on 05/03/2016    . fluticasone (FLONASE) 50 MCG/ACT nasal spray Place 2 sprays into both nostrils daily. Needs appt for further refills 48 g 3  . levothyroxine (SYNTHROID, LEVOTHROID) 75 MCG tablet Take 1 tablet (75 mcg total) by mouth daily before breakfast.  TAKE 1 TABLET BY MOUTH DAILY BEFORE BREAKFAST 90 tablet 1  . loratadine (CLARITIN) 10 MG tablet Take 1 tablet (10 mg total) by mouth at bedtime. 90 tablet 3  . Lysine 500 MG TABS Take 1 tablet (500 mg total) by mouth at bedtime. 90 tablet 3  . TURMERIC PO Take 1 capsule by mouth 2 (two) times daily.    Marland Kitchen FLUoxetine (PROZAC) 10 MG capsule     . diclofenac sodium (VOLTAREN) 1 % GEL Apply 2 g topically 4 (four) times daily as needed.     No facility-administered medications prior to visit.     Review of Systems  Patient denies headache, fevers, malaise, unintentional weight loss, skin rash, eye pain, sinus congestion and sinus pain, sore throat, dysphagia,  hemoptysis , cough, dyspnea, wheezing, chest pain, palpitations, orthopnea, edema, abdominal pain, nausea, melena, diarrhea, constipation, flank pain, dysuria, hematuria, urinary  Frequency, nocturia, numbness, tingling, seizures,  Focal weakness, Loss of consciousness,  Tremor, insomnia, depression, anxiety, and suicidal ideation.     Objective:  BP 104/72 (BP Location: Left Arm, Patient Position: Sitting, Cuff Size: Normal)   Pulse 61   Temp 97.9 F (36.6 C) (Oral)   Resp 15   Ht 5\' 6"  (1.676 m)   Wt 137 lb 1.9 oz (62.2 kg)   LMP 08/26/2012   SpO2 98%   BMI 22.13 kg/m   Physical Exam   General appearance: alert, cooperative and appears stated age Head: Normocephalic, without obvious abnormality, atraumatic Eyes: conjunctivae/corneas clear. PERRL, EOM's intact. Fundi benign. Ears: normal TM's and external ear canals both ears Nose: Nares normal. Septum midline. Mucosa normal. No drainage or sinus tenderness. Throat: lips, mucosa, and tongue normal; teeth and gums normal Neck: no adenopathy, no carotid bruit, no JVD, supple, symmetrical, trachea midline and thyroid not enlarged, symmetric, no tenderness/mass/nodules Lungs: clear to auscultation bilaterally Breasts: normal appearance, no masses or tenderness Heart: regular rate  and rhythm, S1, S2 normal, no murmur, click, rub or gallop Abdomen: soft, non-tender; bowel sounds normal; no masses,  no organomegaly Extremities: extremities normal, atraumatic, no cyanosis or edema Pulses: 2+ and symmetric Skin: Skin color, texture, turgor normal. No rashes or lesions Neurologic: Alert and oriented X 3, normal strength and tone. Normal symmetric reflexes. Normal coordination and gait.      Assessment & Plan:   Problem List Items Addressed This Visit      Unprioritized   Vitamin D deficiency    Managed with oral daily supplements.      Relevant Orders   VITAMIN D 25 Hydroxy (Vit-D Deficiency, Fractures) (Completed)   Restless legs    Symptoms have resolved; CBC is normal   Lab Results  Component Value Date   WBC 5.4 04/23/2019   HGB 13.8 04/23/2019   HCT 40.7 04/23/2019   MCV 96.7 04/23/2019   PLT 267 04/23/2019         Relevant Orders   CBC with Differential/Platelet (Completed)   Hypothyroidism    Thyroid function is WNL on current dose.  No current changes needed.   Lab Results  Component Value Date   TSH 1.36 04/23/2019         Relevant Orders   TSH (Completed)  Encounter for preventive health examination    age appropriate education and counseling updated, referrals for preventative services and immunizations addressed, dietary and smoking counseling addressed, most recent labs reviewed.  I have personally reviewed and have noted:  1) the patient's medical and social history 2) The pt's use of alcohol, tobacco, and illicit drugs 3) The patient's current medications and supplements 4) Functional ability including ADL's, fall risk, home safety risk, hearing and visual impairment 5) Diet and physical activities 6) Evidence for depression or mood disorder 7) The patient's height, weight, and BMI have been recorded in the chart  I have made referrals, and provided counseling and education based on review of the above      Relevant  Orders   Comprehensive metabolic panel (Completed)   Lipid panel (Completed)   Depression, major, single episode, mild ([place])    She has had no  recurrence of  symptoms and is no longer taking wellbutrin      Relevant Medications   FLUoxetine (PROZAC) 10 MG capsule    Other Visit Diagnoses    Encounter for hepatitis C screening test for low risk patient    -  Primary   Relevant Orders   Hepatitis C antibody      I have discontinued Kristina Barnes's diclofenac sodium. I am also having her maintain her fish oil-omega-3 fatty acids, Lysine, loratadine, Vitamin D3, TURMERIC PO, fluticasone, levothyroxine, and FLUoxetine.  No orders of the defined types were placed in this encounter.   Medications Discontinued During This Encounter  Medication Reason  . diclofenac sodium (VOLTAREN) 1 % GEL Error    Follow-up: No follow-ups on file.   Crecencio Mc, MD

## 2019-04-25 NOTE — Assessment & Plan Note (Signed)
Managed with oral daily supplements.

## 2019-04-25 NOTE — Assessment & Plan Note (Signed)
She has had no  recurrence of  symptoms and is no longer taking wellbutrin

## 2019-04-25 NOTE — Assessment & Plan Note (Signed)
Thyroid function is WNL on current dose.  No current changes needed.   Lab Results  Component Value Date   TSH 1.36 04/23/2019

## 2019-04-25 NOTE — Assessment & Plan Note (Signed)
Symptoms have resolved; CBC is normal   Lab Results  Component Value Date   WBC 5.4 04/23/2019   HGB 13.8 04/23/2019   HCT 40.7 04/23/2019   MCV 96.7 04/23/2019   PLT 267 04/23/2019

## 2019-04-25 NOTE — Assessment & Plan Note (Signed)

## 2019-04-27 LAB — CBC WITH DIFFERENTIAL/PLATELET
Absolute Monocytes: 540 cells/uL (ref 200–950)
Basophils Absolute: 32 cells/uL (ref 0–200)
Basophils Relative: 0.6 %
Eosinophils Absolute: 97 cells/uL (ref 15–500)
Eosinophils Relative: 1.8 %
HCT: 40.7 % (ref 35.0–45.0)
Hemoglobin: 13.8 g/dL (ref 11.7–15.5)
Lymphs Abs: 1955 cells/uL (ref 850–3900)
MCH: 32.8 pg (ref 27.0–33.0)
MCHC: 33.9 g/dL (ref 32.0–36.0)
MCV: 96.7 fL (ref 80.0–100.0)
MPV: 10.1 fL (ref 7.5–12.5)
Monocytes Relative: 10 %
Neutro Abs: 2776 cells/uL (ref 1500–7800)
Neutrophils Relative %: 51.4 %
Platelets: 267 10*3/uL (ref 140–400)
RBC: 4.21 10*6/uL (ref 3.80–5.10)
RDW: 12.1 % (ref 11.0–15.0)
Total Lymphocyte: 36.2 %
WBC: 5.4 10*3/uL (ref 3.8–10.8)

## 2019-04-27 LAB — COMPREHENSIVE METABOLIC PANEL
AG Ratio: 2 (calc) (ref 1.0–2.5)
ALT: 11 U/L (ref 6–29)
AST: 15 U/L (ref 10–35)
Albumin: 4.7 g/dL (ref 3.6–5.1)
Alkaline phosphatase (APISO): 55 U/L (ref 37–153)
BUN: 14 mg/dL (ref 7–25)
CO2: 24 mmol/L (ref 20–32)
Calcium: 9.5 mg/dL (ref 8.6–10.4)
Chloride: 103 mmol/L (ref 98–110)
Creat: 0.7 mg/dL (ref 0.50–0.99)
Globulin: 2.4 g/dL (calc) (ref 1.9–3.7)
Glucose, Bld: 78 mg/dL (ref 65–99)
Potassium: 4 mmol/L (ref 3.5–5.3)
Sodium: 139 mmol/L (ref 135–146)
Total Bilirubin: 0.5 mg/dL (ref 0.2–1.2)
Total Protein: 7.1 g/dL (ref 6.1–8.1)

## 2019-04-27 LAB — LIPID PANEL
Cholesterol: 218 mg/dL — ABNORMAL HIGH (ref <200)
HDL: 97 mg/dL (ref >=50)
LDL Cholesterol (Calc): 107 mg/dL (calc) — ABNORMAL HIGH
Non-HDL Cholesterol (Calc): 121 mg/dL (calc) (ref <130)
Total CHOL/HDL Ratio: 2.2 (calc) (ref <5.0)
Triglycerides: 62 mg/dL (ref <150)

## 2019-04-27 LAB — HEPATITIS C ANTIBODY
Hepatitis C Ab: NONREACTIVE
SIGNAL TO CUT-OFF: 0.01 (ref <1.00)

## 2019-04-27 LAB — VITAMIN D 25 HYDROXY (VIT D DEFICIENCY, FRACTURES): Vit D, 25-Hydroxy: 55 ng/mL (ref 30–100)

## 2019-04-27 LAB — TSH: TSH: 1.36 mIU/L (ref 0.40–4.50)

## 2019-04-28 ENCOUNTER — Other Ambulatory Visit: Payer: Self-pay

## 2019-04-28 ENCOUNTER — Ambulatory Visit
Admission: RE | Admit: 2019-04-28 | Discharge: 2019-04-28 | Disposition: A | Discharge status: Home / Self Care | Payer: 59 | Source: Ambulatory Visit | Attending: Internal Medicine | Admitting: Internal Medicine

## 2019-04-28 ENCOUNTER — Other Ambulatory Visit: Payer: Self-pay | Admitting: Internal Medicine

## 2019-04-28 DIAGNOSIS — Z1231 Encounter for screening mammogram for malignant neoplasm of breast: Secondary | ICD-10-CM | POA: Insufficient documentation

## 2019-11-08 DIAGNOSIS — L570 Actinic keratosis: Secondary | ICD-10-CM | POA: Diagnosis not present

## 2019-11-08 DIAGNOSIS — D2230 Melanocytic nevi of unspecified part of face: Secondary | ICD-10-CM | POA: Diagnosis not present

## 2019-11-08 DIAGNOSIS — Z85828 Personal history of other malignant neoplasm of skin: Secondary | ICD-10-CM | POA: Diagnosis not present

## 2019-11-08 DIAGNOSIS — Z1283 Encounter for screening for malignant neoplasm of skin: Secondary | ICD-10-CM | POA: Diagnosis not present

## 2019-11-08 DIAGNOSIS — D229 Melanocytic nevi, unspecified: Secondary | ICD-10-CM | POA: Diagnosis not present

## 2019-11-08 DIAGNOSIS — L820 Inflamed seborrheic keratosis: Secondary | ICD-10-CM | POA: Diagnosis not present

## 2019-11-08 DIAGNOSIS — L821 Other seborrheic keratosis: Secondary | ICD-10-CM | POA: Diagnosis not present

## 2019-11-08 DIAGNOSIS — D225 Melanocytic nevi of trunk: Secondary | ICD-10-CM | POA: Diagnosis not present

## 2019-11-08 DIAGNOSIS — D1801 Hemangioma of skin and subcutaneous tissue: Secondary | ICD-10-CM | POA: Diagnosis not present

## 2019-11-12 ENCOUNTER — Other Ambulatory Visit: Payer: Self-pay | Admitting: Internal Medicine

## 2019-12-10 DIAGNOSIS — Z135 Encounter for screening for eye and ear disorders: Secondary | ICD-10-CM | POA: Diagnosis not present

## 2019-12-10 DIAGNOSIS — H18413 Arcus senilis, bilateral: Secondary | ICD-10-CM | POA: Diagnosis not present

## 2019-12-10 DIAGNOSIS — H524 Presbyopia: Secondary | ICD-10-CM | POA: Diagnosis not present

## 2019-12-10 DIAGNOSIS — H35363 Drusen (degenerative) of macula, bilateral: Secondary | ICD-10-CM | POA: Diagnosis not present

## 2019-12-17 ENCOUNTER — Telehealth: Payer: Self-pay

## 2019-12-17 MED ORDER — FLUTICASONE PROPIONATE 50 MCG/ACT NA SUSP: 2.0000 | Freq: Every day | NASAL | 0 refills | Status: AC

## 2019-12-17 NOTE — Telephone Encounter (Signed)
Has not been filled since 2019. Last OV: 04/23/2019 Next OV: not scheduled

## 2019-12-17 NOTE — Addendum Note (Signed)
Addended by: Crecencio Mc on: 12/17/2019 03:15 PM   Modules accepted: Orders

## 2019-12-17 NOTE — Telephone Encounter (Signed)
Flonase nasal spray °

## 2019-12-17 NOTE — Telephone Encounter (Signed)
What med?

## 2020-01-04 ENCOUNTER — Other Ambulatory Visit: Payer: Self-pay | Admitting: Neurology

## 2020-01-04 DIAGNOSIS — R200 Anesthesia of skin: Secondary | ICD-10-CM

## 2020-01-04 DIAGNOSIS — R202 Paresthesia of skin: Secondary | ICD-10-CM

## 2020-01-05 ENCOUNTER — Other Ambulatory Visit
Admission: RE | Admit: 2020-01-05 | Discharge: 2020-01-05 | Disposition: A | Discharge status: Home / Self Care | Payer: 59 | Source: Ambulatory Visit | Attending: Neurology | Admitting: Neurology

## 2020-01-05 DIAGNOSIS — R200 Anesthesia of skin: Secondary | ICD-10-CM | POA: Diagnosis not present

## 2020-01-05 DIAGNOSIS — E538 Deficiency of other specified B group vitamins: Secondary | ICD-10-CM | POA: Diagnosis not present

## 2020-01-05 DIAGNOSIS — R202 Paresthesia of skin: Secondary | ICD-10-CM | POA: Insufficient documentation

## 2020-01-05 DIAGNOSIS — E531 Pyridoxine deficiency: Secondary | ICD-10-CM | POA: Diagnosis not present

## 2020-01-05 LAB — COMPREHENSIVE METABOLIC PANEL
ALT: 23 U/L (ref 0–44)
AST: 21 U/L (ref 15–41)
Albumin: 4.2 g/dL (ref 3.5–5.0)
Alkaline Phosphatase: 60 U/L (ref 38–126)
Anion gap: 9 (ref 5–15)
BUN: 15 mg/dL (ref 8–23)
CO2: 27 mmol/L (ref 22–32)
Calcium: 9.6 mg/dL (ref 8.9–10.3)
Chloride: 105 mmol/L (ref 98–111)
Creatinine, Ser: 0.54 mg/dL (ref 0.44–1.00)
GFR calc Af Amer: >60 mL/min (ref >60)
GFR calc non Af Amer: >60 mL/min (ref >60)
Glucose, Bld: 117 mg/dL — ABNORMAL HIGH (ref 70–99)
Potassium: 4.2 mmol/L (ref 3.5–5.1)
Sodium: 141 mmol/L (ref 135–145)
Total Bilirubin: 0.8 mg/dL (ref 0.3–1.2)
Total Protein: 7 g/dL (ref 6.5–8.1)

## 2020-01-05 LAB — CBC WITH DIFFERENTIAL/PLATELET
Abs Immature Granulocytes: 0.02 10*3/uL (ref 0.00–0.07)
Basophils Absolute: 0 10*3/uL (ref 0.0–0.1)
Basophils Relative: 0 %
Eosinophils Absolute: 0 10*3/uL (ref 0.0–0.5)
Eosinophils Relative: 0 %
HCT: 40.2 % (ref 36.0–46.0)
Hemoglobin: 13.3 g/dL (ref 12.0–15.0)
Immature Granulocytes: 0 %
Lymphocytes Relative: 29 %
Lymphs Abs: 2.2 10*3/uL (ref 0.7–4.0)
MCH: 29.8 pg (ref 26.0–34.0)
MCHC: 33.1 g/dL (ref 30.0–36.0)
MCV: 89.9 fL (ref 80.0–100.0)
Monocytes Absolute: 0.9 10*3/uL (ref 0.1–1.0)
Monocytes Relative: 12 %
Neutro Abs: 4.3 10*3/uL (ref 1.7–7.7)
Neutrophils Relative %: 59 %
Platelets: 250 10*3/uL (ref 150–400)
RBC: 4.47 MIL/uL (ref 3.87–5.11)
RDW: 13.4 % (ref 11.5–15.5)
WBC: 7.4 10*3/uL (ref 4.0–10.5)
nRBC: 0 % (ref 0.0–0.2)

## 2020-01-05 LAB — FOLATE: Folate: 23 ng/mL (ref >5.9)

## 2020-01-05 LAB — VITAMIN D 25 HYDROXY (VIT D DEFICIENCY, FRACTURES): Vit D, 25-Hydroxy: 53.43 ng/mL (ref 30–100)

## 2020-01-05 LAB — HEMOGLOBIN A1C
Hgb A1c MFr Bld: 5.8 % — ABNORMAL HIGH (ref 4.8–5.6)
Mean Plasma Glucose: 119.76 mg/dL

## 2020-01-05 LAB — TSH: TSH: 0.457 u[IU]/mL (ref 0.350–4.500)

## 2020-01-05 LAB — SEDIMENTATION RATE: Sed Rate: 3 mm/hr (ref 0–30)

## 2020-01-05 LAB — VITAMIN B12: Vitamin B-12: 457 pg/mL (ref 180–914)

## 2020-01-06 LAB — ANA W/REFLEX: Anti Nuclear Antibody (ANA): NEGATIVE

## 2020-01-08 LAB — VITAMIN B1: Vitamin B1 (Thiamine): 110.3 nmol/L (ref 66.5–200.0)

## 2020-01-09 LAB — VITAMIN B6: Vitamin B6: 14.5 ug/L (ref 2.0–32.8)

## 2020-01-10 LAB — MISC LABCORP TEST (SEND OUT)
LabCorp test name: 1495
Labcorp test code: 1495

## 2020-01-15 ENCOUNTER — Other Ambulatory Visit: Payer: Self-pay

## 2020-01-15 ENCOUNTER — Ambulatory Visit
Admission: RE | Admit: 2020-01-15 | Discharge: 2020-01-15 | Disposition: A | Discharge status: Home / Self Care | Payer: 59 | Source: Ambulatory Visit | Attending: Neurology | Admitting: Neurology

## 2020-01-15 DIAGNOSIS — R202 Paresthesia of skin: Secondary | ICD-10-CM | POA: Diagnosis not present

## 2020-01-15 DIAGNOSIS — R200 Anesthesia of skin: Secondary | ICD-10-CM | POA: Diagnosis not present

## 2020-01-15 DIAGNOSIS — R42 Dizziness and giddiness: Secondary | ICD-10-CM | POA: Diagnosis not present

## 2020-01-15 MED ORDER — GADOBUTROL 1 MMOL/ML IV SOLN
6.0000 mL | Freq: Once | INTRAVENOUS | Status: AC | PRN
  Administered 2020-01-15: 6 mL via INTRAVENOUS

## 2020-03-14 ENCOUNTER — Other Ambulatory Visit: Payer: Self-pay

## 2020-03-16 ENCOUNTER — Encounter: Payer: Self-pay | Admitting: Internal Medicine

## 2020-03-16 ENCOUNTER — Other Ambulatory Visit: Payer: Self-pay

## 2020-03-16 ENCOUNTER — Ambulatory Visit (INDEPENDENT_AMBULATORY_CARE_PROVIDER_SITE_OTHER): Payer: 59 | Admitting: Internal Medicine

## 2020-03-16 VITALS — BP 100/66 | HR 57 | Temp 96.7°F | Resp 14 | Ht 66.0 in | Wt 131.8 lb

## 2020-03-16 DIAGNOSIS — Z0000 Encounter for general adult medical examination without abnormal findings: Secondary | ICD-10-CM | POA: Diagnosis not present

## 2020-03-16 DIAGNOSIS — F320 Major depressive disorder, single episode, mild: Secondary | ICD-10-CM

## 2020-03-16 DIAGNOSIS — Z1231 Encounter for screening mammogram for malignant neoplasm of breast: Secondary | ICD-10-CM

## 2020-03-16 DIAGNOSIS — E039 Hypothyroidism, unspecified: Secondary | ICD-10-CM

## 2020-03-16 MED ORDER — ZOSTER VAC RECOMB ADJUVANTED 50 MCG/0.5ML IM SUSR: 0.5000 mL | Freq: Once | INTRAMUSCULAR | 1 refills | Status: AC

## 2020-03-16 NOTE — Progress Notes (Signed)
Patient ID: Kristina Barnes, female    DOB: 11-23-1955  Age: 63 y.o. MRN: [id number]  The patient is here for annual  wellness examination and management of other chronic and acute problems.  This visit occurred during the SARS-CoV-2 public health emergency.  Safety protocols were in place, including screening questions prior to the visit, additional usage of staff PPE, and extensive cleaning of exam room while observing appropriate contact time as indicated for disinfecting solutions.    Patient has received both doses of the available COVID 19 vaccine without complications.  Patient continues to mask when outside of the home except when walking in yard or at safe distances from others .  Patient denies any change in mood or development of unhealthy behaviors resuting from the pandemic's restriction of activities and socialization.    The risk factors are reflected and updated  in the social history.  The roster of all physicians providing medical care to patient - is listed in the Snapshot section of the chart.  Activities of daily living:  The patient is 100% independent in all ADLs: dressing, toileting, feeding as well as independent mobility  Home safety : The patient has smoke detectors in the home. They wear seatbelts.  There are no firearms at home. There is no violence in the home.   There is no risks for hepatitis, STDs or HIV. There is no   history of blood transfusion. They have no travel history to infectious disease endemic areas of the world.  The patient has seen their dentist in the last six month. The patient has seen their eye doctor in the last year.  Patient denies  hearing difficulty. Patient does not have excessive sun exposure. Discussed the need for sun protection: hats, long sleeves and use of sunscreen if there is significant sun exposure.   Diet: the importance of a healthy diet is discussed. Patient has a healthy diet.  The benefits of regular aerobic exercise were  discussed. She walks 4 times per week ,  30 to 60 minutes .   Depression screen: there are no signs or vegative symptoms of untreated depression- irritability, change in appetite, anhedonia, sadness/tearfullness.   The following portions of the patient's history were reviewed and updated as appropriate: allergies, current medications, past family history, past medical history,  past surgical history, past social history  and problem list.  Visual acuity was not assessed per patient preference since she has regular follow up with her ophthalmologist. Hearing and body mass index were assessed and reviewed.   During the course of the visit the patient was educated and counseled about appropriate screening and preventive services including : fall prevention , diabetes screening, nutrition counseling, colorectal cancer screening, and recommended immunizations.    CC: The primary encounter diagnosis was Encounter for screening mammogram for malignant neoplasm of breast. Diagnoses of Encounter for preventive health examination, Depression, major, single episode, mild ([place]), and Acquired hypothyroidism were also pertinent to this visit.  Doing well.  Working at [employer]. 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  Seeing dr Kristina Barnes In [place] but he is retiring.  Discussed continuing the medication through the summer. Working on fixing her sleep deficit. Exercising regularly , has lost weight during COVID restrictions because of change in lifestyle    History Kristina Barnes has a past medical history of Allergy, Menopause syndrome, Migraine (y-41), Sensory neuropathy,  Skin cancer (2015), and Thyroid disease.   She has a past surgical history that includes Appendectomy and Colonoscopy with propofol (N/A, 03/25/2017).   Her family history includes Alcohol abuse in her brother and sister; Arthritis in her mother;  Cancer in her father; Depression in her sister; Hypertension in her mother; Osteoporosis in her mother; Ovarian cancer (age of onset: 71) in her mother; Pulmonary embolism in her sister.She reports that she has never smoked. She has never used smokeless tobacco. She reports current alcohol use of about 7.0 standard drinks of alcohol per week. She reports that she does not use drugs.  Outpatient Medications Prior to Visit  Medication Sig Dispense Refill  . Cholecalciferol (VITAMIN D3) 1000 UNITS CAPS Take 1 capsule by mouth daily. Reported on 05/03/2016    . fish oil-omega-3 fatty acids 1000 MG capsule Take 1 g by mouth at bedtime. Reported on 05/03/2016    . FLUoxetine (PROZAC) 10 MG capsule Take 30 mg by mouth daily.     . fluticasone (FLONASE) 50 MCG/ACT nasal spray Place 2 sprays into both nostrils daily. 15.8 mL 0  . levothyroxine (SYNTHROID) 75 MCG tablet TAKE 1 TABLET BY MOUTH DAILY BEFORE BREAKFAST. 90 tablet 1  . loratadine (CLARITIN) 10 MG tablet Take 1 tablet (10 mg total) by mouth at bedtime. 90 tablet 3  . Lysine 500 MG TABS Take 1 tablet (500 mg total) by mouth at bedtime. 90 tablet 3  . TURMERIC PO Take 1 capsule by mouth 2 (two) times daily.     No facility-administered medications prior to visit.    Review of Systems   Patient denies headache, fevers, malaise, unintentional weight loss, skin rash, eye pain, sinus congestion and sinus pain, sore throat, dysphagia,  hemoptysis , cough, dyspnea, wheezing, chest pain, palpitations, orthopnea, edema, abdominal pain, nausea, melena, diarrhea, constipation, flank pain, dysuria, hematuria, urinary  Frequency, nocturia, numbness, tingling, seizures,  Focal weakness, Loss of consciousness,  Tremor, insomnia, depression, anxiety, and suicidal ideation.      Objective:  BP 100/66 (BP Location: Left Arm, Patient Position: Sitting, Cuff Size: Normal)   Pulse (!) 57   Temp (!) 96.7 F (35.9 C) (Temporal)   Resp 14   Ht 5\' 6"  (1.676 m)    Wt 131 lb 12.8 oz (59.8 kg)   LMP 08/26/2012   SpO2 99%   BMI 21.27 kg/m   Physical Exam  General appearance: alert, cooperative and appears stated age Head: Normocephalic, without obvious abnormality, atraumatic Eyes: conjunctivae/corneas clear. PERRL, EOM's intact. Fundi benign. Ears: normal TM's and external ear canals both ears Nose: Nares normal. Septum midline. Mucosa normal. No drainage or sinus tenderness. Throat: lips, mucosa, and tongue normal; teeth and gums normal Neck: no adenopathy, no carotid bruit, no JVD, supple, symmetrical, trachea midline and thyroid not enlarged, symmetric, no tenderness/mass/nodules Lungs: clear to auscultation bilaterally Breasts: normal appearance, no masses or tenderness Heart: regular rate and rhythm, S1, S2 normal, no murmur, click, rub or gallop Abdomen: soft, non-tender; bowel sounds normal; no masses,  no organomegaly Extremities: extremities normal, atraumatic, no cyanosis or edema Pulses: 2+ and symmetric Skin: Skin color, texture, turgor normal. No rashes or lesions Neurologic: Alert and oriented X 3, normal strength and tone. Normal symmetric reflexes. Normal coordination and gait.    Assessment & Plan:   Problem List Items Addressed This Visit      Unprioritized   Depression, major, single episode, mild ([place])    She has had no  recurrence of  symptoms  and has reduced her dose of prozac to 30 mg daily.  No changes today       Encounter for preventive health examination    age appropriate education and counseling updated, referrals for preventative services and immunizations addressed, dietary and smoking counseling addressed, most recent labs reviewed.  I have personally reviewed and have noted:  1) the patient's medical and social history 2) The pt's use of alcohol, tobacco, and illicit drugs 3) The patient's current medications and supplements 4) Functional ability including ADL's, fall risk, home safety risk, hearing and  visual impairment 5) Diet and physical activities 6) Evidence for depression or mood disorder 7) The patient's height, weight, and BMI have been recorded in the chart  I have made referrals, and provided counseling and education based on review of the above      Hypothyroidism    Thyroid function is WNL on current dose.  No current changes needed.   Lab Results  Component Value Date   TSH 0.457 01/05/2020          Other Visit Diagnoses    Encounter for screening mammogram for malignant neoplasm of breast    -  Primary   Relevant Orders   MM 3D SCREEN BREAST BILATERAL      I am having Kristina Barnes start on Zoster Vaccine Adjuvanted. I am also having her maintain her fish oil-omega-3 fatty acids, Lysine, loratadine, Vitamin D3, TURMERIC PO, FLUoxetine, levothyroxine, and fluticasone.  Meds ordered this encounter  Medications  . Zoster Vaccine Adjuvanted [facility]) injection    Sig: Inject 0.5 mLs into the muscle once for 1 dose.    Dispense:  1 each    Refill:  1    There are no discontinued medications.  Follow-up: Return in about 1 year (around 03/16/2021).   Crecencio Mc, MD

## 2020-03-16 NOTE — Patient Instructions (Signed)
Good to see you!  Happy to refill proaac when needed  We can offer you shingrx here if [facility] does not offer it to you.    Health Maintenance for Postmenopausal Women Menopause is a normal process in which your ability to get pregnant comes to an end. This process happens slowly over many months or years, usually between the ages of 8 and 74. Menopause is complete when you have missed your menstrual periods for 12 months. It is important to talk with your health care provider about some of the most common conditions that affect women after menopause (postmenopausal women). These include heart disease, cancer, and bone loss (osteoporosis). Adopting a healthy lifestyle and getting preventive care can help to promote your health and wellness. The actions you take can also lower your chances of developing some of these common conditions. What should I know about menopause? During menopause, you may get a number of symptoms, such as:  Hot flashes. These can be moderate or severe.  Night sweats.  Decrease in sex drive.  Mood swings.  Headaches.  Tiredness.  Irritability.  Memory problems.  Insomnia. Choosing to treat or not to treat these symptoms is a decision that you make with your health care provider. Do I need hormone replacement therapy?  Hormone replacement therapy is effective in treating symptoms that are caused by menopause, such as hot flashes and night sweats.  Hormone replacement carries certain risks, especially as you become older. If you are thinking about using estrogen or estrogen with progestin, discuss the benefits and risks with your health care provider. What is my risk for heart disease and stroke? The risk of heart disease, heart attack, and stroke increases as you age. One of the causes may be a change in the body's hormones during menopause. This can affect how your body uses dietary fats, triglycerides, and cholesterol. Heart attack and stroke are  medical emergencies. There are many things that you can do to help prevent heart disease and stroke. Watch your blood pressure  High blood pressure causes heart disease and increases the risk of stroke. This is more likely to develop in people who have high blood pressure readings, are of African descent, or are overweight.  Have your blood pressure checked: ? Every 3-5 years if you are 31-28 years of age. ? Every year if you are 44 years old or older. Eat a healthy diet   Eat a diet that includes plenty of vegetables, fruits, low-fat dairy products, and lean protein.  Do not eat a lot of foods that are high in solid fats, added sugars, or sodium. Get regular exercise Get regular exercise. This is one of the most important things you can do for your health. Most adults should:  Try to exercise for at least 150 minutes each week. The exercise should increase your heart rate and make you sweat (moderate-intensity exercise).  Try to do strengthening exercises at least twice each week. Do these in addition to the moderate-intensity exercise.  Spend less time sitting. Even light physical activity can be beneficial. Other tips  Work with your health care provider to achieve or maintain a healthy weight.  Do not use any products that contain nicotine or tobacco, such as cigarettes, e-cigarettes, and chewing tobacco. If you need help quitting, ask your health care provider.  Know your numbers. Ask your health care provider to check your cholesterol and your blood sugar (glucose). Continue to have your blood tested as directed by your  health care provider. Do I need screening for cancer? Depending on your health history and family history, you may need to have cancer screening at different stages of your life. This may include screening for:  Breast cancer.  Cervical cancer.  Lung cancer.  Colorectal cancer. What is my risk for osteoporosis? After menopause, you may be at increased  risk for osteoporosis. Osteoporosis is a condition in which bone destruction happens more quickly than new bone creation. To help prevent osteoporosis or the bone fractures that can happen because of osteoporosis, you may take the following actions:  If you are 7-71 years old, get at least 1,000 mg of calcium and at least 600 mg of vitamin D per day.  If you are older than age 39 but younger than age 27, get at least 1,200 mg of calcium and at least 600 mg of vitamin D per day.  If you are older than age 15, get at least 1,200 mg of calcium and at least 800 mg of vitamin D per day. Smoking and drinking excessive alcohol increase the risk of osteoporosis. Eat foods that are rich in calcium and vitamin D, and do weight-bearing exercises several times each week as directed by your health care provider. How does menopause affect my mental health? Depression may occur at any age, but it is more common as you become older. Common symptoms of depression include:  Low or sad mood.  Changes in sleep patterns.  Changes in appetite or eating patterns.  Feeling an overall lack of motivation or enjoyment of activities that you previously enjoyed.  Frequent crying spells. Talk with your health care provider if you think that you are experiencing depression. General instructions See your health care provider for regular wellness exams and vaccines. This may include:  Scheduling regular health, dental, and eye exams.  Getting and maintaining your vaccines. These include: ? Influenza vaccine. Get this vaccine each year before the flu season begins. ? Pneumonia vaccine. ? Shingles vaccine. ? Tetanus, diphtheria, and pertussis (Tdap) booster vaccine. Your health care provider may also recommend other immunizations. Tell your health care provider if you have ever been abused or do not feel safe at home. Summary  Menopause is a normal process in which your ability to get pregnant comes to an  end.  This condition causes hot flashes, night sweats, decreased interest in sex, mood swings, headaches, or lack of sleep.  Treatment for this condition may include hormone replacement therapy.  Take actions to keep yourself healthy, including exercising regularly, eating a healthy diet, watching your weight, and checking your blood pressure and blood sugar levels.  Get screened for cancer and depression. Make sure that you are up to date with all your vaccines. This information is not intended to replace advice given to you by your health care provider. Make sure you discuss any questions you have with your health care provider. Document Revised: 09/23/2018 Document Reviewed: 09/23/2018 Elsevier Patient Education  2020 [employer].

## 2020-03-18 NOTE — Assessment & Plan Note (Signed)
She has had no  recurrence of  symptoms and has reduced her dose of prozac to 30 mg daily.  No changes today

## 2020-03-18 NOTE — Assessment & Plan Note (Signed)
Thyroid function is WNL on current dose.  No current changes needed.   Lab Results  Component Value Date   TSH 0.457 01/05/2020

## 2020-03-18 NOTE — Assessment & Plan Note (Signed)

## 2020-03-29 DIAGNOSIS — N6459 Other signs and symptoms in breast: Secondary | ICD-10-CM

## 2020-03-29 DIAGNOSIS — G43809 Other migraine, not intractable, without status migrainosus: Secondary | ICD-10-CM

## 2020-04-03 ENCOUNTER — Other Ambulatory Visit: Payer: Self-pay | Admitting: Internal Medicine

## 2020-04-03 DIAGNOSIS — Z1231 Encounter for screening mammogram for malignant neoplasm of breast: Secondary | ICD-10-CM

## 2020-04-03 DIAGNOSIS — N63 Unspecified lump in unspecified breast: Secondary | ICD-10-CM

## 2020-04-03 NOTE — Addendum Note (Signed)
Addended by: Crecencio Mc on: 04/03/2020 05:10 PM   Modules accepted: Orders

## 2020-04-04 ENCOUNTER — Telehealth: Payer: Self-pay

## 2020-04-04 NOTE — Telephone Encounter (Signed)
Where are they?  I ordered them myself yesterday,  But there none to be singed just now

## 2020-04-04 NOTE — Telephone Encounter (Signed)
Can you sign off on the orders for the diagnostic mammogram that has been ordered for pt?

## 2020-04-05 NOTE — Telephone Encounter (Signed)
Dr. Derrel Nip stated that she was the one that placed the orders so they should be signed.

## 2020-04-10 ENCOUNTER — Ambulatory Visit
Admission: RE | Admit: 2020-04-10 | Discharge: 2020-04-10 | Disposition: A | Discharge status: Home / Self Care | Payer: 59 | Source: Ambulatory Visit | Attending: Internal Medicine | Admitting: Internal Medicine

## 2020-04-10 DIAGNOSIS — Z1231 Encounter for screening mammogram for malignant neoplasm of breast: Secondary | ICD-10-CM

## 2020-04-10 DIAGNOSIS — R922 Inconclusive mammogram: Secondary | ICD-10-CM | POA: Diagnosis not present

## 2020-04-10 DIAGNOSIS — N63 Unspecified lump in unspecified breast: Secondary | ICD-10-CM

## 2020-04-10 DIAGNOSIS — N6489 Other specified disorders of breast: Secondary | ICD-10-CM | POA: Diagnosis not present

## 2020-04-18 NOTE — Addendum Note (Signed)
Addended by: Crecencio Mc on: 04/18/2020 09:56 AM   Modules accepted: Orders

## 2020-05-04 ENCOUNTER — Encounter: Payer: Self-pay | Admitting: Neurology

## 2020-05-04 ENCOUNTER — Other Ambulatory Visit: Payer: Self-pay

## 2020-05-04 ENCOUNTER — Ambulatory Visit (INDEPENDENT_AMBULATORY_CARE_PROVIDER_SITE_OTHER): Payer: 59 | Admitting: Neurology

## 2020-05-04 VITALS — BP 104/69 | HR 66 | Ht 66.0 in | Wt 131.0 lb

## 2020-05-04 DIAGNOSIS — R269 Unspecified abnormalities of gait and mobility: Secondary | ICD-10-CM

## 2020-05-04 DIAGNOSIS — I679 Cerebrovascular disease, unspecified: Secondary | ICD-10-CM | POA: Diagnosis not present

## 2020-05-04 DIAGNOSIS — R9082 White matter disease, unspecified: Secondary | ICD-10-CM | POA: Insufficient documentation

## 2020-05-04 MED ORDER — METHYLPREDNISOLONE 4 MG PO TABS: ORAL_TABLET | ORAL | 0 refills | Status: DC

## 2020-05-04 NOTE — Progress Notes (Signed)
[facility]  PATIENT: Kristina Barnes DOB: 1956/10/03  REFERRING DOCTOR OR PCP:  Deborra Medina, MD SOURCE: Patient, imaging and lab reports, MRI images personally reviewed.  _________________________________   HISTORICAL  CHIEF COMPLAINT:  Chief Complaint  Patient presents with  . New Patient (Initial Visit)    RM 13, alone. Internal referral from Deborra Medina, MD (PCP) for 2nd opinion for migraines vs MS.  She reports back in 2008 she developed fasciculations in her muscles. Left thumb would twitch intermittently. PCP ordered MRI. She saw neurologist at [facility] about ab MRI. She had repeat MRI. Felt she had some peripheral neuropathy. 2nd MRI looked okay but told her she may have MS back in 2008. She had hx vertigo at age 74 for a week.   . Foot Problem    Has hx of random episodes where right foot drags.   . Dizziness    Woke up middle of the night with vertigo in March 2021. Ended up throwing up. Could not drive d/t severity. Took sx 5 days to resolve. She had a positive rombergs, right side. Saw neurologist in [place] for repeat MRI. Placed on steroids which helped resolve sx. MRI showed on report showed some white matter changes, possibly some demyelation per pt. Neurologist/nurse told her MRI was normal. She was concerned about this and wanted second opinion. Wants imaging from 2008 compared to 2021.  . Migraine    Gets ocular migraines that are worsening.  Has intermittent vision changes.     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    In her thirties, migraines changes to involve an aura without migraine.   These are now rare.   However, she  wwas still get occasional visual shimmering that is mild and not associated with other symptoms.    She gets a severe headache rarely, last one earlier this month.     One morning in March,  she woke up with vertigo and had N/V and was veering to the right with a wide based gait. She had mild 'brain fog' similar to when she has an ocular migraine.   It was constant and present even while resting.   However, at least once, she had a significant short term flare with looking down and back up.    When symptoms persisted, she saw Dr. Manuella Ghazi and was prescribed a steroid.   She improved.    She had another episode lasting just one day with a single episode of vomiting.   No change in hearing during the episodes.   No ear or mastoid pain.      She has had more fatigue.    She sleeps well most nights.     Vascular risk factors:  She has migraines with aura.   She does not have HTN, DM or smoking  Imaging review: I personally reviewed the MRI of the brain and the intracranial MR angiogram images from 08/15/2007.  The MRI of the brain showed some scattered T2/FLAIR hyperintense foci predominantly in the subcortical and deep white matter.  None of the foci appear to be acute.  They did not enhance.  The brain was otherwise normal.  MR angiogram showed normal  arteries.  I also reviewed the MRI of the brain from 01/15/2020 and compared it to her previous brain MRI.  During the 13 years, there has been some progression of the white matter foci.  There also some foci not present on the previous scan.  The periatrial confluency is more noted on the current MRI.  However, none of the foci appear to be acute.  They do not enhance.  Laboratory tests: March 2021:  ANA, ESR, vitamin B12, vitamin B1, vitamin D, CBC/differential and CMP were normal or noncontributory. Hemoglobin A1c was borderline at 5.8. Glucose was slightly elevated at 117.  REVIEW OF SYSTEMS: Constitutional: No fevers, chills, sweats, or change in  appetite Eyes: No visual changes, double vision, eye pain Ear, nose and throat: No hearing loss, ear pain, nasal congestion, sore throat Cardiovascular: No chest pain, palpitations Respiratory: No shortness of breath at rest or with exertion.   No wheezes GastrointestinaI: No nausea, vomiting, diarrhea, abdominal pain, fecal incontinence Genitourinary: No dysuria, urinary retention or frequency.  No nocturia. Musculoskeletal: No neck pain, back pain Integumentary: No rash, pruritus, skin lesions Neurological: as above Psychiatric: No depression at this time.  No anxiety Endocrine: No palpitations, diaphoresis, change in appetite, change in weigh or increased thirst Hematologic/Lymphatic: No anemia, purpura, petechiae. Allergic/Immunologic: No itchy/runny eyes, nasal congestion, recent allergic reactions, rashes  ALLERGIES: Allergies  Allergen Reactions  . Penicillins Rash  . Sulfa Antibiotics Rash    HOME MEDICATIONS:  Current Outpatient Medications:  .  Cholecalciferol (VITAMIN D3) 1000 UNITS CAPS, Take 1 capsule by mouth daily. Reported on 05/03/2016, Disp: , Rfl:  .  fish oil-omega-3 fatty acids 1000 MG capsule, Take 1 g by mouth at bedtime. Reported on 05/03/2016, Disp: , Rfl:  .  FLUoxetine (PROZAC) 10 MG capsule, Take 20 mg by mouth daily. , Disp: , Rfl:  .  fluticasone (FLONASE) 50 MCG/ACT nasal spray, Place 2 sprays into both nostrils daily., Disp: 15.8 mL, Rfl: 0 .  levothyroxine (SYNTHROID) 75 MCG tablet, TAKE 1 TABLET BY MOUTH DAILY BEFORE BREAKFAST. (Patient taking differently: at bedtime. ), Disp: 90 tablet, Rfl: 1 .  loratadine (CLARITIN) 10 MG tablet, Take 1 tablet (10 mg total) by mouth at bedtime., Disp: 90 tablet, Rfl: 3 .  Lysine 500 MG TABS, Take 1 tablet (500 mg total) by mouth at bedtime., Disp: 90 tablet, Rfl: 3 .  Magnesium 400 MG TABS, Take 400 mg by mouth daily., Disp: , Rfl:  .  Multiple Vitamin (MULTIVITAMIN) tablet, Take 1 tablet by mouth daily., Disp:  , Rfl:  .  TURMERIC PO, Take 1 capsule by mouth daily. , Disp: , Rfl:  .  methylPREDNISolone (MEDROL) 4 MG tablet, Taper from 6 pills po for one day to 1 pill po the last day over 6 days, Disp: 21 tablet, Rfl: 0  PAST MEDICAL HISTORY: Past Medical History:  Diagnosis Date  . Allergy   . Menopause syndrome   . Migraine y-41  . Sensory neuropathy   . Skin cancer 2015   basal cell  . Thyroid disease     PAST SURGICAL HISTORY: Past Surgical History:  Procedure Laterality Date  . APPENDECTOMY    . COLONOSCOPY WITH PROPOFOL N/A 03/25/2017   Procedure: COLONOSCOPY WITH PROPOFOL;  Surgeon: Lucilla Lame, MD;  Location: [facility] END[facility]COPY;  Service: Endoscopy;  Laterality: N/A;    FAMILY HISTORY: Family History  Problem Relation Age of Onset  . Arthritis Mother        rheumatoid  . Hypertension  Mother   . Ovarian cancer Mother 81  . Osteoporosis Mother   . Cancer Father   . Depression Sister   . Pulmonary embolism Sister   . Alcohol abuse Sister   . Alcohol abuse Brother   . Breast cancer Neg Hx     SOCIAL HISTORY:  Social History   Socioeconomic History  . Marital status: Married    Spouse name: Not on file  . Number of children: Not on file  . Years of education: Not on file  . Highest education level: Not on file  Occupational History  . Occupation: [employer]  Tobacco Use  . Smoking status: Never Smoker  . Smokeless tobacco: Never Used  Substance and Sexual Activity  . Alcohol use: Yes    Alcohol/week: 7.0 standard drinks    Types: 7 Standard drinks or equivalent per week    Comment: Fri/Sat night (7 drinks per week)  . Drug use: No  . Sexual activity: Yes    Birth control/protection: Post-menopausal  Other Topics Concern  . Not on file  Social History Narrative   Lives with husband   Caffeine use: Coffee daily   Right handed    Social Determinants of Health   Financial Resource Strain:   . Difficulty of Paying Living Expenses:   Food Insecurity:   .  Worried About Charity fundraiser in the Last Year:   . Arboriculturist in the Last Year:   Transportation Needs:   . Film/video editor (Medical):   Marland Kitchen Lack of Transportation (Non-Medical):   Physical Activity:   . Days of Exercise per Week:   . Minutes of Exercise per Session:   Stress:   . Feeling of Stress :   Social Connections:   . Frequency of Communication with Friends and Family:   . Frequency of Social Gatherings with Friends and Family:   . Attends Religious Services:   . Active Member of Clubs or Organizations:   . Attends Archivist Meetings:   Marland Kitchen Marital Status:   Intimate Partner Violence:   . Fear of Current or Ex-Partner:   . Emotionally Abused:   Marland Kitchen Physically Abused:   . Sexually Abused:      PHYSICAL EXAM  Vitals:   05/04/20 0846  BP: 104/69  Pulse: 66  Weight: 131 lb (59.4 kg)  Height: '5\' 6"'$  (1.676 m)    Body mass index is 21.14 kg/m.   General: The patient is well-developed and well-nourished and in no acute distress  HEENT:  Head is [place]/AT.  Sclera are anicteric.  Funduscopic exam shows normal optic discs and retinal vessels.  Neck: No carotid bruits are noted.  The neck is nontender.  Cardiovascular: The heart has a regular rate and rhythm with a normal S1 and S2. There were no murmurs, gallops or rubs.    Skin: Extremities are without rash or  edema.  Musculoskeletal:  Back is nontender  Neurologic Exam  Mental status: The patient is alert and oriented x 3 at the time of the examination. The patient has apparent normal recent and remote memory, with an apparently normal attention span and concentration ability.   Speech is normal.  Cranial nerves: Extraocular movements are full. Pupils are equal, round, and reactive to light and accomodation.  Visual fields are full.  Facial symmetry is present. There is good facial sensation to soft touch bilaterally.Facial strength is normal.  Trapezius and sternocleidomastoid strength is  normal. No dysarthria is noted.  The tongue is midline, and the patient has symmetric elevation of the soft palate. No obvious hearing deficits are noted.  Motor:  Muscle bulk is normal.   Tone is normal. Strength is  5 / 5 in all 4 extremities.   Sensory: Sensory testing is intact to pinprick, soft touch and vibration sensation in all 4 extremities.  Coordination: Cerebellar testing reveals good finger-nose-finger and heel-to-shin bilaterally.  Gait and station: Station is normal.   Gait is normal. Tandem gait is mildly wide. Romberg is negative.   Reflexes: Deep tendon reflexes are symmetric and normal bilaterally.   Plantar responses are flexor.    DIAGNOSTIC DATA (LABS, IMAGING, TESTING) - I reviewed patient records, labs, notes, testing and imaging myself where available.  Lab Results  Component Value Date   WBC 7.4 01/05/2020   HGB 13.3 01/05/2020   HCT 40.2 01/05/2020   MCV 89.9 01/05/2020   PLT 250 01/05/2020      Component Value Date/Time   NA 141 01/05/2020 0917   K 4.2 01/05/2020 0917   CL 105 01/05/2020 0917   CO2 27 01/05/2020 0917   GLUCOSE 117 (H) 01/05/2020 0917   BUN 15 01/05/2020 0917   CREATININE 0.54 01/05/2020 0917   CREATININE 0.70 04/23/2019 1542   CALCIUM 9.6 01/05/2020 0917   PROT 7.0 01/05/2020 0917   ALBUMIN 4.2 01/05/2020 0917   AST 21 01/05/2020 0917   ALT 23 01/05/2020 0917   ALKPHOS 60 01/05/2020 0917   BILITOT 0.8 01/05/2020 0917   GFRNONAA >60 01/05/2020 0917   GFRAA >60 01/05/2020 0917   Lab Results  Component Value Date   CHOL 218 (H) 04/23/2019   HDL 97 04/23/2019   LDLCALC 107 (H) 04/23/2019   TRIG 62 04/23/2019   CHOLHDL 2.2 04/23/2019   Lab Results  Component Value Date   HGBA1C 5.8 (H) 01/05/2020   Lab Results  Component Value Date   VITAMINB12 457 01/05/2020   Lab Results  Component Value Date   TSH 0.457 01/05/2020       ASSESSMENT AND PLAN  White matter abnormality on MRI of brain - Plan:  Homocysteine  Gait disturbance  Cerebrovascular small vessel disease   In summary, 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 I had a long conversation with her about the pattern of white matter change and probable significance. The pattern is much more consistent with chronic microvascular ischemic change than with demyelination. Therefore, I think the possibility of MS is very very low. She does not have typical cerebrovascular risk factors. Specifically she does not have hypertension, diabetes or smoking history. She has had blood work for vasculitis recently which was normal. It is possible that her history of migraines is playing a role in the white matter changes. We will also check homocystine to rule out hyper homocystinemia. We discussed that if she has clinical neurologic symptoms more intense then her migraines or aura, I would also consider an echocardiogram with bubble contrast to assess for possibility of PFO. She will take a baby aspirin daily.  She will return to see me or call as needed if she has new or worsening neurologic symptoms.  Thank you for asking me to see Dr. Oran Rein.   Please let me know if I may be of further assistance with her or other patients in the future.   Alannis Hsia A. Felecia Shelling, MD, [facility] 5/73/2202, 54:27 PM Certified in Neurology, Clinical Neurophysiology, Sleep Medicine and Neuroimaging  [facility] [address], [address] [place], [place] [address] [phone] 

## 2020-05-05 LAB — HOMOCYSTEINE: Homocysteine: 10 umol/L (ref 0.0–17.2)

## 2020-05-08 ENCOUNTER — Other Ambulatory Visit: Payer: Self-pay | Admitting: Internal Medicine

## 2020-05-08 ENCOUNTER — Other Ambulatory Visit: Payer: Self-pay | Admitting: Family Medicine

## 2020-06-09 ENCOUNTER — Other Ambulatory Visit: Payer: Self-pay | Admitting: Internal Medicine

## 2020-06-09 MED ORDER — FLUOXETINE HCL 40 MG PO CAPS: 40.0000 mg | ORAL_CAPSULE | Freq: Every day | ORAL | 0 refills | Status: DC

## 2020-08-11 ENCOUNTER — Ambulatory Visit: Payer: 59 | Attending: Internal Medicine

## 2020-08-11 ENCOUNTER — Other Ambulatory Visit: Payer: Self-pay | Admitting: Internal Medicine

## 2020-08-11 DIAGNOSIS — Z23 Encounter for immunization: Secondary | ICD-10-CM

## 2020-08-11 NOTE — Progress Notes (Signed)
   Covid-19 Vaccination Clinic  Name:  Kristina Barnes    MRN: [id number] DOB: [birth year]  08/11/2020  Kristina Barnes was observed post Covid-19 immunization for 15 minutes without incident. She was provided with Vaccine Information Sheet and instruction to access the V-Safe system.   Kristina Barnes was instructed to call 911 with any severe reactions post vaccine: Marland Kitchen Difficulty breathing  . Swelling of face and throat  . A fast heartbeat  . A bad rash all over body  . Dizziness and weakness

## 2020-09-18 ENCOUNTER — Other Ambulatory Visit: Payer: Self-pay | Admitting: Internal Medicine

## 2020-09-18 ENCOUNTER — Other Ambulatory Visit: Payer: Self-pay

## 2020-09-18 MED ORDER — FLUOXETINE HCL 40 MG PO CAPS: 40.0000 mg | ORAL_CAPSULE | Freq: Every day | ORAL | 0 refills | Status: DC

## 2020-11-07 ENCOUNTER — Ambulatory Visit (INDEPENDENT_AMBULATORY_CARE_PROVIDER_SITE_OTHER): Payer: 59 | Admitting: Dermatology

## 2020-11-07 ENCOUNTER — Other Ambulatory Visit: Payer: Self-pay

## 2020-11-07 DIAGNOSIS — Z85828 Personal history of other malignant neoplasm of skin: Secondary | ICD-10-CM

## 2020-11-07 DIAGNOSIS — D229 Melanocytic nevi, unspecified: Secondary | ICD-10-CM | POA: Diagnosis not present

## 2020-11-07 DIAGNOSIS — Z1283 Encounter for screening for malignant neoplasm of skin: Secondary | ICD-10-CM

## 2020-11-07 DIAGNOSIS — L821 Other seborrheic keratosis: Secondary | ICD-10-CM

## 2020-11-07 DIAGNOSIS — L814 Other melanin hyperpigmentation: Secondary | ICD-10-CM | POA: Diagnosis not present

## 2020-11-07 DIAGNOSIS — L578 Other skin changes due to chronic exposure to nonionizing radiation: Secondary | ICD-10-CM | POA: Diagnosis not present

## 2020-11-07 DIAGNOSIS — L820 Inflamed seborrheic keratosis: Secondary | ICD-10-CM | POA: Diagnosis not present

## 2020-11-07 DIAGNOSIS — D1800 Hemangioma unspecified site: Secondary | ICD-10-CM

## 2020-11-07 NOTE — Patient Instructions (Signed)

## 2020-11-07 NOTE — Progress Notes (Unsigned)
   Follow-Up Visit   Subjective  Kristina Barnes is a 65 y.o. female who presents for the following: Annual Exam (History of BCC of left infraorbital - TBSE today) and Other (Small spot on left forehead - does not bother her, she just wants it checked/). The patient presents for Total-Body Skin Exam (TBSE) for skin cancer screening and mole check.  The following portions of the chart were reviewed this encounter and updated as appropriate:   Tobacco  Allergies  Meds  Problems  Med Hx  Surg Hx  Fam Hx     Review of Systems:  No other skin or systemic complaints except as noted in HPI or Assessment and Plan.  Objective  Well appearing patient in no apparent distress; mood and affect are within normal limits.  A full examination was performed including scalp, head, eyes, ears, nose, lips, neck, chest, axillae, abdomen, back, buttocks, bilateral upper extremities, bilateral lower extremities, hands, feet, fingers, toes, fingernails, and toenails. All findings within normal limits unless otherwise noted below.  Objective  Left infraorbital: Well healed scar with no evidence of recurrence.   Objective  Left Forehead x 1, left popliteal x 1, right ant thigh x 2 (4): Erythematous keratotic or waxy stuck-on papule or plaque.    Assessment & Plan    Lentigines - Scattered tan macules - Discussed due to sun exposure - Benign, observe - Call for any changes  Seborrheic Keratoses - Stuck-on, waxy, tan-brown papules and plaques  - Discussed benign etiology and prognosis. - Observe - Call for any changes  Melanocytic Nevi - Tan-brown and/or pink-flesh-colored symmetric macules and papules - Benign appearing on exam today - Observation - Call clinic for new or changing moles - Recommend daily use of broad spectrum spf 30+ sunscreen to sun-exposed areas.   Hemangiomas - Red papules - Discussed benign nature - Observe - Call for any changes  Actinic Damage - Chronic,  secondary to cumulative UV/sun exposure - diffuse scaly erythematous macules with underlying dyspigmentation - Recommend daily broad spectrum sunscreen SPF 30+ to sun-exposed areas, reapply every 2 hours as needed.  - Call for new or changing lesions.  Skin cancer screening performed today.  History of basal cell carcinoma (BCC) Left infraorbital  Clear. Observe for recurrence. Call clinic for new or changing lesions.  Recommend regular skin exams, daily broad-spectrum spf 30+ sunscreen use, and photoprotection.     Inflamed seborrheic keratosis (4) Left Forehead x 1, left popliteal x 1, right ant thigh x 2  Destruction of lesion - Left Forehead x 1, left popliteal x 1, right ant thigh x 2 Complexity: simple   Destruction method: cryotherapy   Informed consent: discussed and consent obtained   Timeout:  patient name, date of birth, surgical site, and procedure verified Lesion destroyed using liquid nitrogen: Yes   Region frozen until ice ball extended beyond lesion: Yes   Outcome: patient tolerated procedure well with no complications   Post-procedure details: wound care instructions given    Skin cancer screening  Return in about 1 year (around 11/07/2021) for TBSE.   I, Ashok Cordia, CMA, am acting as scribe for Sarina Ser, MD .  Documentation: I have reviewed the above documentation for accuracy and completeness, and I agree with the above.  Sarina Ser, MD

## 2020-11-09 ENCOUNTER — Other Ambulatory Visit: Payer: Self-pay | Admitting: Family Medicine

## 2020-11-10 ENCOUNTER — Encounter: Payer: Self-pay | Admitting: Dermatology

## 2021-01-02 DIAGNOSIS — Z135 Encounter for screening for eye and ear disorders: Secondary | ICD-10-CM | POA: Diagnosis not present

## 2021-01-02 DIAGNOSIS — H52223 Regular astigmatism, bilateral: Secondary | ICD-10-CM | POA: Diagnosis not present

## 2021-01-02 DIAGNOSIS — H5203 Hypermetropia, bilateral: Secondary | ICD-10-CM | POA: Diagnosis not present

## 2021-01-03 ENCOUNTER — Other Ambulatory Visit: Payer: Self-pay | Admitting: Internal Medicine

## 2021-02-19 ENCOUNTER — Other Ambulatory Visit: Payer: Self-pay

## 2021-02-19 MED FILL — Levothyroxine Sodium Tab 75 MCG: ORAL | 90 days supply | Qty: 90 | Fill #0 | Status: AC

## 2021-03-02 ENCOUNTER — Other Ambulatory Visit: Payer: Self-pay

## 2021-03-02 ENCOUNTER — Other Ambulatory Visit: Payer: Self-pay | Admitting: Nurse Practitioner

## 2021-03-02 DIAGNOSIS — H1013 Acute atopic conjunctivitis, bilateral: Secondary | ICD-10-CM

## 2021-03-02 MED ORDER — PREDNISOLONE ACETATE 1 % OP SUSP
2.0000 [drp] | Freq: Four times a day (QID) | OPHTHALMIC | 0 refills | Status: DC
  Filled 2021-03-02: qty 5, 13d supply, fill #0
  Filled 2021-03-02: qty 5, 7d supply, fill #0
  Filled 2021-03-02: qty 5, 9d supply, fill #0

## 2021-03-02 NOTE — Progress Notes (Signed)
Patient with inflammation to conjunctival and scleral portions bilaterally without visual disturbances. Initially had bacterial conjunctivitis earlier in the week, treated with gentamycin ophthalmic. No longer having crusted draining. Ongoing complaint is redness throughout conjunctival and scleral portions of eyes bilaterally. No swelling noted to surrounding tissue- no active drainage.   Stop antibiotic drops as inflammatory process may be secondary to antibiotic sensitivity.   Start steroidal drops today, avoid contact use and f/u as necessary with ophthalmology.   Meds ordered this encounter  Medications  . prednisoLONE acetate (PRED MILD) 0.12 % ophthalmic suspension    Sig: Place 2 drops into both eyes 4 (four) times daily. Use drops 2-4 times daily for up to 10 days. Remove contact lenses prior to use.    Dispense:  5 mL    Refill:  0

## 2021-03-30 ENCOUNTER — Other Ambulatory Visit (HOSPITAL_COMMUNITY)
Admission: RE | Admit: 2021-03-30 | Discharge: 2021-03-30 | Disposition: A | Discharge status: Home / Self Care | Payer: 59 | Source: Ambulatory Visit | Attending: Internal Medicine | Admitting: Internal Medicine

## 2021-03-30 ENCOUNTER — Encounter: Payer: Self-pay | Admitting: Internal Medicine

## 2021-03-30 ENCOUNTER — Ambulatory Visit (INDEPENDENT_AMBULATORY_CARE_PROVIDER_SITE_OTHER): Payer: 59 | Admitting: Internal Medicine

## 2021-03-30 ENCOUNTER — Other Ambulatory Visit: Payer: Self-pay

## 2021-03-30 VITALS — BP 90/62 | HR 68 | Temp 96.0°F | Resp 14 | Ht 66.0 in | Wt 133.6 lb

## 2021-03-30 DIAGNOSIS — E039 Hypothyroidism, unspecified: Secondary | ICD-10-CM | POA: Diagnosis not present

## 2021-03-30 DIAGNOSIS — Z124 Encounter for screening for malignant neoplasm of cervix: Secondary | ICD-10-CM

## 2021-03-30 DIAGNOSIS — Z1231 Encounter for screening mammogram for malignant neoplasm of breast: Secondary | ICD-10-CM

## 2021-03-30 DIAGNOSIS — Z0000 Encounter for general adult medical examination without abnormal findings: Secondary | ICD-10-CM | POA: Diagnosis not present

## 2021-03-30 DIAGNOSIS — R7301 Impaired fasting glucose: Secondary | ICD-10-CM | POA: Diagnosis not present

## 2021-03-30 DIAGNOSIS — E782 Mixed hyperlipidemia: Secondary | ICD-10-CM

## 2021-03-30 LAB — LIPID PANEL
Cholesterol: 214 mg/dL — ABNORMAL HIGH (ref 0–200)
HDL: 89.1 mg/dL (ref >39.00)
LDL Cholesterol: 108 mg/dL — ABNORMAL HIGH (ref 0–99)
NonHDL: 124.45
Total CHOL/HDL Ratio: 2
Triglycerides: 84 mg/dL (ref 0.0–149.0)
VLDL: 16.8 mg/dL (ref 0.0–40.0)

## 2021-03-30 LAB — COMPREHENSIVE METABOLIC PANEL
ALT: 14 U/L (ref 0–35)
AST: 16 U/L (ref 0–37)
Albumin: 4.8 g/dL (ref 3.5–5.2)
Alkaline Phosphatase: 66 U/L (ref 39–117)
BUN: 21 mg/dL (ref 6–23)
CO2: 29 mEq/L (ref 19–32)
Calcium: 9.5 mg/dL (ref 8.4–10.5)
Chloride: 102 mEq/L (ref 96–112)
Creatinine, Ser: 0.68 mg/dL (ref 0.40–1.20)
GFR: 92.24 mL/min (ref >60.00)
Glucose, Bld: 90 mg/dL (ref 70–99)
Potassium: 3.9 mEq/L (ref 3.5–5.1)
Sodium: 139 mEq/L (ref 135–145)
Total Bilirubin: 0.5 mg/dL (ref 0.2–1.2)
Total Protein: 7.1 g/dL (ref 6.0–8.3)

## 2021-03-30 LAB — HEMOGLOBIN A1C: Hgb A1c MFr Bld: 5.7 % (ref 4.6–6.5)

## 2021-03-30 LAB — TSH: TSH: 0.45 u[IU]/mL (ref 0.35–4.50)

## 2021-03-30 MED ORDER — ZOSTER VAC RECOMB ADJUVANTED 50 MCG/0.5ML IM SUSR: 0.5000 mL | Freq: Once | INTRAMUSCULAR | 1 refills | Status: AC

## 2021-03-30 NOTE — Progress Notes (Signed)
Patient ID: Kristina Barnes, female    DOB: 04/25/1956  Age: 65 y.o. MRN: [id number]  The patient is here for annual GYN/wellness  examination and management of other chronic and acute problems.   The risk factors are reflected in the social history.  The roster of all physicians providing medical care to patient - is listed in the Snapshot section of the chart.  Activities of daily living:  The patient is 100% independent in all ADLs: dressing, toileting, feeding as well as independent mobility  Home safety : The patient has smoke detectors in the home. They wear seatbelts.  There are no firearms at home. There is no violence in the home.   There is no risks for hepatitis, STDs or HIV. There is no   history of blood transfusion. They have no travel history to infectious disease endemic areas of the world.  The patient has seen their dentist in the last six month. They have seen their eye doctor in the last year. She denies hearing difficulty with regard to whispered voices and some television programs.  They have deferred audiologic testing in the last year.  They do not  have excessive sun exposure. Discussed the need for sun protection: hats, long sleeves and use of sunscreen if there is significant sun exposure.   Diet: the importance of a healthy diet is discussed. They do have a healthy diet.  The benefits of regular aerobic exercise were discussed. She exercises 4 per week ,  60 minutes.   Depression screen: there are no signs or vegative symptoms of depression- irritability, change in appetite, anhedonia, sadness/tearfullness.  Cognitive assessment: the patient manages all their financial and personal affairs and is actively engaged. They could relate day,date,year and events; recalled 2/3 objects at 3 minutes; performed clock-face test normally.  The following portions of the patient's history were reviewed and updated as appropriate: allergies, current medications, past family  history, past medical history,  past surgical history, past social history  and problem list.  Visual acuity was not assessed per patient preference since she has regular follow up with her ophthalmologist. Hearing and body mass index were assessed and reviewed.   During the course of the visit the patient was educated and counseled about appropriate screening and preventive services including : fall prevention , diabetes screening, nutrition counseling, colorectal cancer screening, and recommended immunizations.    CC: The primary encounter diagnosis was Encounter for screening mammogram for malignant neoplasm of breast. Diagnoses of Cervical cancer screening, Acquired hypothyroidism, Impaired fasting glucose, Moderate mixed hyperlipidemia not requiring statin therapy, and Encounter for preventive health examination were also pertinent to this visit.  No issues.  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   She has a past surgical history that includes Appendectomy and Colonoscopy with propofol (N/A, 03/25/2017).   Her family history includes Alcohol abuse in her brother and sister; Arthritis in her mother; Cancer in her father; Depression in her sister; Hypertension in her mother; Osteoporosis in her mother; Ovarian cancer (age of onset: 26) in her mother; Pulmonary embolism in her sister.She reports that she has never smoked. She has never used smokeless tobacco. She reports current alcohol use of about 7.0 standard drinks of alcohol per week. She reports that she does not use drugs.  Outpatient Medications Prior to Visit  Medication Sig Dispense Refill   Cholecalciferol (VITAMIN D3) 1000 UNITS CAPS Take 1 capsule by mouth daily. Reported on 05/03/2016     fish oil-omega-3 fatty acids 1000 MG capsule Take 1 g  by mouth at bedtime. Reported on 05/03/2016     FLUoxetine (PROZAC) 40 MG capsule TAKE 1 CAPSULE (40 MG TOTAL) BY MOUTH DAILY. 90 capsule 0   fluticasone (FLONASE) 50 MCG/ACT nasal spray Place 2 sprays into both nostrils daily. 15.8 mL 0   levothyroxine (SYNTHROID) 75 MCG tablet TAKE 1 TABLET BY MOUTH DAILY BEFORE BREAKFAST. 90 tablet 1   loratadine (CLARITIN) 10 MG tablet Take 1 tablet (10 mg total) by mouth at bedtime. 90 tablet 3   Lysine 500 MG TABS Take 1 tablet (500 mg total) by mouth at bedtime. 90 tablet 3   Magnesium 400 MG TABS Take 400 mg by mouth daily.     Multiple Vitamin (MULTIVITAMIN) tablet Take 1 tablet by mouth daily.     TURMERIC PO Take 1 capsule by mouth daily.      COVID-19 mRNA vaccine, Pfizer, 30 MCG/0.3ML injection USE AS DIRECTED .3 mL 0   methylPREDNISolone (MEDROL) 4 MG tablet Taper from 6 pills po for one day to 1 pill po the last day over 6 days 21 tablet 0   prednisoLONE acetate (PRED FORTE) 1 % ophthalmic suspension Place 1 drop into both eyes 4 (four) times daily. 5 mL 0   No facility-administered medications prior to visit.    Review of Systems  Patient denies headache, fevers, malaise, unintentional weight loss, skin rash, eye pain, sinus congestion and sinus pain, sore throat, dysphagia,  hemoptysis , cough, dyspnea, wheezing, chest pain, palpitations, orthopnea, edema, abdominal pain, nausea, melena, diarrhea, constipation, flank pain, dysuria, hematuria, urinary  Frequency, nocturia, numbness, tingling, seizures,  Focal weakness, Loss of consciousness,  Tremor, insomnia, depression, anxiety, and suicidal ideation.     Objective:  BP 90/62 (BP Location: Left Arm, Patient Position: Sitting, Cuff Size: Normal)   Pulse 68   Temp (!) 96 F (35.6 C) (Temporal)   Resp 14   Ht 5\' 6"  (1.676 m)   Wt 133 lb 9.6 oz (60.6 kg)   LMP 08/26/2012   SpO2 99%   BMI 21.56 kg/m   Physical Exam / General Appearance:    Alert, cooperative, no distress, appears  stated age  Head:    Normocephalic, without obvious abnormality, atraumatic  Eyes:    PERRL, conjunctiva/corneas clear, EOM's intact, fundi    benign, both eyes  Ears:    Normal TM's and external ear canals, both ears  Nose:   Nares normal, septum midline, mucosa normal, no drainage    or sinus tenderness  Throat:   Lips, mucosa, and tongue normal; teeth and gums normal  Neck:   Supple, symmetrical, trachea midline, no adenopathy;    thyroid:  no enlargement/tenderness/nodules; no carotid   bruit or JVD  Back:     Symmetric, no curvature, ROM normal, no CVA tenderness  Lungs:     Clear to auscultation bilaterally, respirations unlabored  Chest Wall:    No tenderness or deformity   Heart:    Regular rate and rhythm, S1 and S2 normal, no murmur, rub   or gallop  Breast Exam:    No tenderness, masses, or nipple abnormality  Abdomen:     Soft, non-tender, bowel sounds active all four quadrants,    no masses, no organomegaly  Genitalia:    Pelvic: cervix normal in appearance,  external genitalia normal, no adnexal masses or tenderness, no cervical motion tenderness, rectovaginal septum normal, uterus normal size, shape, and consistency and vagina normal without discharge  Extremities:   Extremities normal, atraumatic, no cyanosis or edema  Pulses:   2+ and symmetric all extremities  Skin:   Skin color, texture, turgor normal, no rashes or lesions  Lymph nodes:   Cervical, supraclavicular, and axillary nodes normal  Neurologic:   CNII-XII intact, normal strength, sensation and reflexes    throughout      Assessment & Plan:   Problem List Items Addressed This Visit       Unprioritized   Encounter for preventive health examination    age appropriate education and counseling updated, referrals for preventative services and immunizations addressed, dietary and smoking counseling addressed, most recent labs reviewed.  I have personally reviewed and have noted:   1) the patient's medical  and social history 2) The pt's use of alcohol, tobacco, and illicit drugs 3) The patient's current medications and supplements 4) Functional ability including ADL's, fall risk, home safety risk, hearing and visual impairment 5) Diet and physical activities 6) Evidence for depression or mood disorder 7) The patient's height, weight, and BMI have been recorded in the chart   I have made referrals, and provided counseling and education based on review of the above       Hypothyroidism   Relevant Orders   TSH (Completed)   Other Visit Diagnoses     Encounter for screening mammogram for malignant neoplasm of breast    -  Primary   Relevant Orders   MM 3D SCREEN BREAST BILATERAL   Cervical cancer screening       Relevant Orders   Cytology - PAP   Impaired fasting glucose       Relevant Orders   Comprehensive metabolic panel (Completed)   Hemoglobin A1c (Completed)   Moderate mixed hyperlipidemia not requiring statin therapy       Relevant Orders   Lipid panel (Completed)       I have discontinued Duana A. Sumpter's methylPREDNISolone, COVID-19 mRNA vaccine (Pfizer), and prednisoLONE acetate. I am also having her start on Zoster Vaccine Adjuvanted. Additionally, I am having her maintain her fish oil-omega-3 fatty acids, Lysine, loratadine, Vitamin D3, TURMERIC PO, fluticasone, multivitamin, Magnesium, FLUoxetine, and levothyroxine.  Meds ordered this encounter  Medications   Zoster Vaccine Adjuvanted [facility]) injection    Sig: Inject 0.5 mLs into the muscle once for 1 dose.    Dispense:  1 each    Refill:  1    Medications Discontinued During This Encounter  Medication Reason   COVID-19 mRNA vaccine, Pfizer, 30 MCG/0.3ML injection    methylPREDNISolone (MEDROL) 4 MG tablet    prednisoLONE acetate (PRED FORTE) 1 % ophthalmic suspension     Follow-up: No follow-ups on file.   Crecencio Mc, MD

## 2021-03-30 NOTE — Patient Instructions (Signed)
Good to see you!   Send wonderful pics of your trip!  [phone]    Your annual mammogram has been ordered.  Please call [place] to scheduled per their request   [phone]

## 2021-04-01 ENCOUNTER — Other Ambulatory Visit: Payer: Self-pay | Admitting: Internal Medicine

## 2021-04-01 NOTE — Assessment & Plan Note (Signed)

## 2021-04-02 ENCOUNTER — Other Ambulatory Visit: Payer: Self-pay

## 2021-04-02 LAB — CYTOLOGY - PAP
Comment: NEGATIVE
Diagnosis: NEGATIVE
High risk HPV: NEGATIVE

## 2021-04-02 MED ORDER — FLUOXETINE HCL 40 MG PO CAPS
ORAL_CAPSULE | Freq: Every day | ORAL | 0 refills | Status: DC
  Filled 2021-04-02: qty 90, 90d supply, fill #0

## 2021-05-17 ENCOUNTER — Other Ambulatory Visit: Payer: Self-pay | Admitting: Family Medicine

## 2021-05-18 ENCOUNTER — Ambulatory Visit
Admission: RE | Admit: 2021-05-18 | Discharge: 2021-05-18 | Disposition: A | Discharge status: Home / Self Care | Payer: 59 | Source: Ambulatory Visit | Attending: Internal Medicine | Admitting: Internal Medicine

## 2021-05-18 ENCOUNTER — Other Ambulatory Visit: Payer: Self-pay

## 2021-05-18 ENCOUNTER — Other Ambulatory Visit: Payer: Self-pay | Admitting: Family Medicine

## 2021-05-18 DIAGNOSIS — Z1231 Encounter for screening mammogram for malignant neoplasm of breast: Secondary | ICD-10-CM | POA: Diagnosis not present

## 2021-05-20 ENCOUNTER — Other Ambulatory Visit: Payer: Self-pay

## 2021-05-21 ENCOUNTER — Other Ambulatory Visit: Payer: Self-pay

## 2021-05-21 MED FILL — Levothyroxine Sodium Tab 75 MCG: ORAL | 90 days supply | Qty: 90 | Fill #0 | Status: AC

## 2021-07-18 ENCOUNTER — Other Ambulatory Visit: Payer: Self-pay

## 2021-07-18 ENCOUNTER — Other Ambulatory Visit: Payer: Self-pay | Admitting: Internal Medicine

## 2021-07-18 MED ORDER — FLUOXETINE HCL 40 MG PO CAPS
ORAL_CAPSULE | Freq: Every day | ORAL | 0 refills | Status: DC
  Filled 2021-07-18: qty 90, 90d supply, fill #0

## 2021-08-23 ENCOUNTER — Other Ambulatory Visit: Payer: Self-pay

## 2021-08-23 MED FILL — Levothyroxine Sodium Tab 75 MCG: ORAL | 90 days supply | Qty: 90 | Fill #1 | Status: AC

## 2021-10-16 ENCOUNTER — Telehealth: Payer: Self-pay | Admitting: Gastroenterology

## 2021-10-16 ENCOUNTER — Encounter: Payer: Self-pay | Admitting: Internal Medicine

## 2021-10-16 ENCOUNTER — Other Ambulatory Visit: Payer: Self-pay

## 2021-10-16 MED ORDER — FLUOXETINE HCL 10 MG PO CAPS
30.0000 mg | ORAL_CAPSULE | Freq: Every day | ORAL | 1 refills | Status: DC
  Filled 2021-10-16: qty 270, 90d supply, fill #0
  Filled 2022-01-23: qty 270, 90d supply, fill #1

## 2021-10-16 NOTE — Telephone Encounter (Signed)
5 yr recall- Patient LVM wanting to get procedure scheduled. Clinical staff will follow up with patient.

## 2021-10-17 ENCOUNTER — Other Ambulatory Visit: Payer: Self-pay

## 2021-10-17 ENCOUNTER — Telehealth: Payer: Self-pay

## 2021-10-17 NOTE — Telephone Encounter (Signed)
Patient wants to schedule her colonoscopy in July sometime I put in a recall for her to be scheduled then

## 2021-11-12 ENCOUNTER — Other Ambulatory Visit: Payer: Self-pay

## 2021-11-12 ENCOUNTER — Ambulatory Visit: Payer: No Typology Code available for payment source | Admitting: Dermatology

## 2021-11-12 DIAGNOSIS — Z1283 Encounter for screening for malignant neoplasm of skin: Secondary | ICD-10-CM | POA: Diagnosis not present

## 2021-11-12 DIAGNOSIS — D229 Melanocytic nevi, unspecified: Secondary | ICD-10-CM

## 2021-11-12 DIAGNOSIS — L578 Other skin changes due to chronic exposure to nonionizing radiation: Secondary | ICD-10-CM

## 2021-11-12 DIAGNOSIS — L814 Other melanin hyperpigmentation: Secondary | ICD-10-CM

## 2021-11-12 DIAGNOSIS — L570 Actinic keratosis: Secondary | ICD-10-CM | POA: Diagnosis not present

## 2021-11-12 DIAGNOSIS — D1800 Hemangioma unspecified site: Secondary | ICD-10-CM | POA: Diagnosis not present

## 2021-11-12 DIAGNOSIS — Z85828 Personal history of other malignant neoplasm of skin: Secondary | ICD-10-CM

## 2021-11-12 DIAGNOSIS — L821 Other seborrheic keratosis: Secondary | ICD-10-CM

## 2021-11-12 MED ORDER — FLUOROURACIL 5 % EX CREA: TOPICAL_CREAM | CUTANEOUS | 0 refills | Status: DC

## 2021-11-12 NOTE — Progress Notes (Signed)
Follow-Up Visit   Subjective  Kristina Barnes is a 66 y.o. female who presents for the following: Annual Exam (Patient has noticed a skin lesion on her L thigh and nose that she would like checked today. She has a history of BCC which was treated with MOHS in 2015.). The patient presents for Total-Body Skin Exam (TBSE) for skin cancer screening and mole check.  The patient has spots, moles and lesions to be evaluated, some may be new or changing.  The following portions of the chart were reviewed this encounter and updated as appropriate:   Tobacco   Allergies   Meds   Problems   Med Hx   Surg Hx   Fam Hx      Review of Systems:  No other skin or systemic complaints except as noted in HPI or Assessment and Plan.  Objective  Well appearing patient in no apparent distress; mood and affect are within normal limits.  A full examination was performed including scalp, head, eyes, ears, nose, lips, neck, chest, axillae, abdomen, back, buttocks, bilateral upper extremities, bilateral lower extremities, hands, feet, fingers, toes, fingernails, and toenails. All findings within normal limits unless otherwise noted below.  Nose x 2 (2) Erythematous thin papules/macules with gritty scale.    Assessment & Plan  AK (actinic keratosis) (2) Nose x 2  Destruction of lesion - Nose x 2 Complexity: simple   Destruction method: cryotherapy   Informed consent: discussed and consent obtained   Timeout:  patient name, date of birth, surgical site, and procedure verified Lesion destroyed using liquid nitrogen: Yes   Region frozen until ice ball extended beyond lesion: Yes   Outcome: patient tolerated procedure well with no complications   Post-procedure details: wound care instructions given    fluorouracil (EFUDEX) 5 % cream - Nose x 2 Apply to the nose BID x 7 days.  Skin cancer screening  Lentigines - Scattered tan macules - Due to sun exposure - Benign-appearing, observe - Recommend daily  broad spectrum sunscreen SPF 30+ to sun-exposed areas, reapply every 2 hours as needed. - Call for any changes  Seborrheic Keratoses - Stuck-on, waxy, tan-brown papules and/or plaques  - Benign-appearing - Discussed benign etiology and prognosis. - Observe - Call for any changes  Melanocytic Nevi - Tan-brown and/or pink-flesh-colored symmetric macules and papules - Benign appearing on exam today - Observation - Call clinic for new or changing moles - Recommend daily use of broad spectrum spf 30+ sunscreen to sun-exposed areas.   Hemangiomas - Red papules - Discussed benign nature - Observe - Call for any changes  Actinic Damage - Severe, confluent actinic changes with pre-cancerous actinic keratoses on the nose - Severe, chronic, not at goal, secondary to cumulative UV radiation exposure over time - diffuse scaly erythematous macules and papules with underlying dyspigmentation - Discussed Prescription "Field Treatment" for Severe, Chronic Confluent Actinic Changes with Pre-Cancerous Actinic Keratoses Field treatment involves treatment of an entire area of skin that has confluent Actinic Changes (Sun/ Ultraviolet light damage) and PreCancerous Actinic Keratoses by method of PhotoDynamic Therapy (PDT) and/or prescription Topical Chemotherapy agents such as 5-fluorouracil, 5-fluorouracil/calcipotriene, and/or imiquimod.  The purpose is to decrease the number of clinically evident and subclinical PreCancerous lesions to prevent progression to development of skin cancer by chemically destroying early precancer changes that may or may not be visible.  It has been shown to reduce the risk of developing skin cancer in the treated area. As a result of treatment, redness,  scaling, crusting, and open sores may occur during treatment course. One or more than one of these methods may be used and may have to be used several times to control, suppress and eliminate the PreCancerous changes. Discussed  treatment course, expected reaction, and possible side effects. - Recommend daily broad spectrum sunscreen SPF 30+ to sun-exposed areas, reapply every 2 hours as needed.  - Staying in the shade or wearing long sleeves, sun glasses (UVA+UVB protection) and wide brim hats (4-inch brim around the entire circumference of the hat) are also recommended. - Call for new or changing lesions. - In one month start 5FU/Calcipotriene cream to the nose BID x 7 days.  History of Basal Cell Carcinoma of the Skin - No evidence of recurrence today - Recommend regular full body skin exams - Recommend daily broad spectrum sunscreen SPF 30+ to sun-exposed areas, reapply every 2 hours as needed.  - Call if any new or changing lesions are noted between office visits  Skin cancer screening performed today.  Return in about 1 year (around 11/12/2022) for TBSE.  Luther Redo, CMA, am acting as scribe for Sarina Ser, MD . Documentation: I have reviewed the above documentation for accuracy and completeness, and I agree with the above.  Sarina Ser, MD

## 2021-11-12 NOTE — Patient Instructions (Addendum)
If You Need Anything After Your Visit ° °If you have any questions or concerns for your doctor, please call our main line at [phone] and press option 4 to reach your doctor's medical assistant. If no one answers, please leave a voicemail as directed and we will return your call as soon as possible. Messages left after 4 pm will be answered the following business day.  ° °You may also send us a message via MyChart. We typically respond to MyChart messages within 1-2 business days. ° °For prescription refills, please ask your pharmacy to contact our office. Our fax number is [phone]. ° °If you have an urgent issue when the clinic is closed that cannot wait until the next business day, you can page your doctor at the number below.   ° °Please note that while we do our best to be available for urgent issues outside of office hours, we are not available 24/7.  ° °If you have an urgent issue and are unable to reach us, you may choose to seek medical care at your doctor's office, retail clinic, urgent care center, or emergency room. ° °If you have a medical emergency, please immediately call 911 or go to the emergency department. ° °Pager Numbers ° °- Dr. Kowalski: [phone] ° °- Dr. Moye: [phone] ° °- Dr. Stewart: [phone] ° °In the event of inclement weather, please call our main line at [phone] for an update on the status of any delays or closures. ° °Dermatology Medication Tips: °Please keep the boxes that topical medications come in in order to help keep track of the instructions about where and how to use these. Pharmacies typically print the medication instructions only on the boxes and not directly on the medication tubes.  ° °If your medication is too expensive, please contact our office at [phone] option 4 or send us a message through MyChart.  ° °We are unable to tell what your co-pay for medications will be in advance as this is different depending on your insurance coverage.  However, we may be able to find a substitute medication at lower cost or fill out paperwork to get insurance to cover a needed medication.  ° °If a prior authorization is required to get your medication covered by your insurance company, please allow us 1-2 business days to complete this process. ° °Drug prices often vary depending on where the prescription is filled and some pharmacies may offer cheaper prices. ° °The website www.goodrx.com contains coupons for medications through different pharmacies. The prices here do not account for what the cost may be with help from insurance (it may be cheaper with your insurance), but the website can give you the price if you did not use any insurance.  °- You can print the associated coupon and take it with your prescription to the pharmacy.  °- You may also stop by our office during regular business hours and pick up a GoodRx coupon card.  °- If you need your prescription sent electronically to a different pharmacy, notify our office through [facility] MyChart or by phone at [phone] option 4. ° ° ° ° °Si Usted Necesita Algo Después de Su Visita ° °También puede enviarnos un mensaje a través de MyChart. Por lo general respondemos a los mensajes de MyChart en el transcurso de 1 a 2 días hábiles. ° °Para renovar recetas, por favor pida a su farmacia que se ponga en contacto con nuestra oficina. Nuestro número de fax es el [phone]. ° °Si tiene   un asunto urgente cuando la clnica est cerrada y que no puede esperar hasta el siguiente da hbil, puede llamar/localizar a su doctor(a) al nmero que aparece a continuacin.   Por favor, tenga en cuenta que aunque hacemos todo lo posible para estar disponibles para asuntos urgentes fuera del horario de [place], no estamos disponibles las 24 horas del da, los 7 das de la [place].   Si tiene un problema urgente y no puede comunicarse con nosotros, puede optar por buscar atencin mdica  en el consultorio de su  doctor(a), en una clnica privada, en un centro de atencin urgente o en una sala de emergencias.  Si tiene Engineering geologist, por favor llame inmediatamente al 911 o vaya a la sala de emergencias.  Nmeros de bper  - Dr. Nehemiah Massed: [phone]  - Dra. Moye: [phone]  - Dra. Nicole Kindred: [phone]  En caso de inclemencias del [place], por favor llame a Johnsie Kindred principal al [phone] para una actualizacin sobre el [place] de cualquier retraso o cierre.  Consejos para la medicacin en dermatologa: Por favor, guarde las cajas en las que vienen los medicamentos de uso tpico para ayudarle a seguir las instrucciones sobre dnde y cmo usarlos. Las farmacias generalmente imprimen las instrucciones del medicamento slo en las cajas y no directamente en los tubos del [place].   Si su medicamento es muy caro, por favor, pngase en contacto con Zigmund Daniel llamando al [phone] y presione la opcin 4 o envenos un mensaje a travs de Pharmacist, community.   No podemos decirle cul ser su copago por los medicamentos por adelantado ya que esto es diferente dependiendo de la cobertura de su seguro. Sin embargo, es posible que podamos encontrar un medicamento sustituto a Electrical engineer un formulario para que el seguro cubra el medicamento que se considera necesario.   Si se requiere una autorizacin previa para que su compaa de seguros [place] su medicamento, por favor permtanos de 1 a 2 das hbiles para completar este proceso.  Los precios de los medicamentos varan con frecuencia dependiendo del Environmental consultant de dnde se surte la receta y alguna farmacias pueden ofrecer precios ms baratos.  El sitio web www.goodrx.com tiene cupones para medicamentos de Airline pilot. Los precios aqu no tienen en cuenta lo que podra costar con la ayuda del seguro (puede ser ms barato con su seguro), pero el sitio web puede darle el precio si no utiliz Research scientist (physical sciences).  - Puede imprimir el cupn  correspondiente y llevarlo con su receta a la farmacia.  - Tambin puede pasar por nuestra oficina durante el horario de atencin regular y Charity fundraiser una tarjeta de cupones de GoodRx.  - Si necesita que su receta se enve electrnicamente a Chiropodist, informe a nuestra oficina a travs de MyChart de [place] o por telfono llamando al [phone] y presione la opcin 4.   5-fluorouracil/calcipotriene cream is is a type of field treatment used to treat precancers, thin skin cancers, and areas of sun damage. Reviewed expected reaction including irritation and mild inflammation potentially progressing to more severe inflammation including redness, scaling, crusting and open sores/erosions.  Reviewed if too much irritation occurs, ensure application of only a thin layer and decrease frequency of use to achieve a tolerable level of inflammation. Recommend applying Vaseline ointment to open sores as needed.  Minimize sun exposure while under treatment. Recommend daily broad spectrum sunscreen SPF 30+ to sun-exposed areas, reapply every 2 hours as needed.     Start 5-fluorouracil/calcipotriene cream  twice a day for 7 days to affected areas including the nose. Prescription sent to [facility]. Patient provided with contact information for pharmacy and advised the pharmacy will mail the prescription to their home. Patient provided with handout reviewing treatment course and side effects and advised to call or message [place] on MyChart with any concerns.

## 2021-11-13 ENCOUNTER — Encounter: Payer: Self-pay | Admitting: Dermatology

## 2021-11-28 ENCOUNTER — Other Ambulatory Visit: Payer: Self-pay

## 2021-11-28 ENCOUNTER — Other Ambulatory Visit: Payer: Self-pay | Admitting: Family Medicine

## 2021-11-28 MED ORDER — LEVOTHYROXINE SODIUM 75 MCG PO TABS
ORAL_TABLET | Freq: Every day | ORAL | 1 refills | Status: DC
  Filled 2021-11-28: qty 90, 90d supply, fill #0
  Filled 2022-02-27: qty 90, 90d supply, fill #1

## 2022-01-23 ENCOUNTER — Other Ambulatory Visit: Payer: Self-pay

## 2022-01-24 ENCOUNTER — Other Ambulatory Visit: Payer: Self-pay

## 2022-01-24 NOTE — Telephone Encounter (Signed)
Pt is ready to schedule colonoscopy, pls call [phone] ?

## 2022-01-29 LAB — COMPREHENSIVE METABOLIC PANEL
Albumin: 4.6 (ref 3.5–5.0)
Calcium: 9.5 (ref 8.7–10.7)
Globulin: 2.3
eGFR: 97

## 2022-01-29 LAB — BASIC METABOLIC PANEL
BUN: 15 (ref 4–21)
CO2: 25 — AB (ref 13–22)
Chloride: 104 (ref 99–108)
Creatinine: 0.7 (ref 0.5–1.1)
Glucose: 92
Potassium: 4.6 mEq/L (ref 3.5–5.1)
Sodium: 142 (ref 137–147)

## 2022-01-29 LAB — LIPID PANEL
Cholesterol: 189 (ref 0–200)
HDL: 90 — AB (ref 35–70)
LDL Cholesterol: 88
Triglycerides: 56 (ref 40–160)

## 2022-01-29 LAB — TSH: TSH: 0.66 (ref 0.41–5.90)

## 2022-01-29 LAB — CBC AND DIFFERENTIAL
HCT: 38 (ref 36–46)
Hemoglobin: 13 (ref 12.0–16.0)
Neutrophils Absolute: 2
Platelets: 262 10*3/uL (ref 150–400)
WBC: 5

## 2022-01-29 LAB — HEPATIC FUNCTION PANEL
ALT: 18 U/L (ref 7–35)
AST: 21 (ref 13–35)
Alkaline Phosphatase: 111 (ref 25–125)
Bilirubin, Total: 0.4

## 2022-01-29 LAB — CBC: RBC: 4.05 (ref 3.87–5.11)

## 2022-01-29 LAB — HEMOGLOBIN A1C: Hemoglobin A1C: 5.5

## 2022-02-11 ENCOUNTER — Telehealth: Payer: Self-pay | Admitting: Gastroenterology

## 2022-02-11 NOTE — Telephone Encounter (Signed)
Patient requesting call back. Patient is due for a repeat colonoscopy.  ?

## 2022-02-12 ENCOUNTER — Telehealth: Payer: Self-pay

## 2022-02-12 NOTE — Telephone Encounter (Signed)
CALLED PATIENT NO ANSWER LEFT VOICEMAIL FOR A CALL BACK ? ?

## 2022-02-13 ENCOUNTER — Other Ambulatory Visit: Payer: Self-pay

## 2022-02-13 DIAGNOSIS — Z86010 Personal history of colonic polyps: Secondary | ICD-10-CM

## 2022-02-13 MED ORDER — NA SULFATE-K SULFATE-MG SULF 17.5-3.13-1.6 GM/177ML PO SOLN
1.0000 | Freq: Once | ORAL | 0 refills | Status: AC
  Filled 2022-02-13: qty 354, 1d supply, fill #0

## 2022-02-13 NOTE — Progress Notes (Signed)
Gastroenterology Pre-Procedure Review ? ?Request Date: 04/23/2022 ?Requesting Physician: Dr. Allen Norris ? ?PATIENT REVIEW QUESTIONS: The patient responded to the following health history questions as indicated:   ? ?1. Are you having any GI issues? no ?2. Do you have a personal history of Polyps? yes (last colonoscopy) ?3. Do you have a family history of Colon Cancer or Polyps? no ?4. Diabetes Mellitus? no ?5. Joint replacements in the past 12 months?no ?6. Major health problems in the past 3 months?no ?7. Any artificial heart valves, MVP, or defibrillator?no ?   ?MEDICATIONS & ALLERGIES:    ?Patient reports the following regarding taking any anticoagulation/antiplatelet therapy:   ?Plavix, Coumadin, Eliquis, Xarelto, Lovenox, Pradaxa, Brilinta, or Effient? no ?Aspirin? no ? ?Patient confirms/reports the following medications:  ?Current Outpatient Medications  ?Medication Sig Dispense Refill  ? Cholecalciferol (VITAMIN D3) 1000 UNITS CAPS Take 1 capsule by mouth daily. Reported on 05/03/2016 (Patient not taking: Reported on 11/12/2021)    ? fish oil-omega-3 fatty acids 1000 MG capsule Take 1 g by mouth at bedtime. Reported on 05/03/2016 (Patient not taking: Reported on 11/12/2021)    ? fluorouracil (EFUDEX) 5 % cream Apply to the nose BID x 7 days. 15 g 0  ? FLUoxetine (PROZAC) 10 MG capsule Take 3 capsules (30 mg total) by mouth daily. 270 capsule 1  ? fluticasone (FLONASE) 50 MCG/ACT nasal spray Place 2 sprays into both nostrils daily. 15.8 mL 0  ? levothyroxine (SYNTHROID) 75 MCG tablet TAKE 1 TABLET BY MOUTH DAILY BEFORE BREAKFAST. 90 tablet 1  ? loratadine (CLARITIN) 10 MG tablet Take 1 tablet (10 mg total) by mouth at bedtime. 90 tablet 3  ? Lysine 500 MG TABS Take 1 tablet (500 mg total) by mouth at bedtime. 90 tablet 3  ? Magnesium 400 MG TABS Take 400 mg by mouth daily.    ? MODERNA COVID-19 BIVAL BOOSTER 50 MCG/0.5ML injection     ? Multiple Vitamin (MULTIVITAMIN) tablet Take 1 tablet by mouth daily.    ? TURMERIC  PO Take 1 capsule by mouth daily.  (Patient not taking: Reported on 11/12/2021)    ? ?No current facility-administered medications for this visit.  ? ? ?Patient confirms/reports the following allergies:  ?Allergies  ?Allergen Reactions  ? Penicillins Rash  ? Sulfa Antibiotics Rash  ? ? ?No orders of the defined types were placed in this encounter. ? ? ?AUTHORIZATION INFORMATION ?Primary Insurance: ?1D#: ?Group #: ? ?Secondary Insurance: ?1D#: ?Group #: ? ?SCHEDULE INFORMATION: ?Date:04/23/2022  ?Time: ?Location:[facility] ? ?

## 2022-02-28 ENCOUNTER — Other Ambulatory Visit: Payer: Self-pay

## 2022-02-28 MED ORDER — ALBUTEROL SULFATE HFA 108 (90 BASE) MCG/ACT IN AERS
INHALATION_SPRAY | RESPIRATORY_TRACT | 0 refills | Status: DC
  Filled 2022-02-28: qty 6.7, 30d supply, fill #0

## 2022-03-01 ENCOUNTER — Other Ambulatory Visit: Payer: Self-pay

## 2022-03-06 ENCOUNTER — Encounter: Payer: Self-pay | Admitting: Gastroenterology

## 2022-03-06 ENCOUNTER — Other Ambulatory Visit: Payer: Self-pay

## 2022-04-15 ENCOUNTER — Encounter: Payer: Self-pay | Admitting: Internal Medicine

## 2022-04-15 DIAGNOSIS — S4992XA Unspecified injury of left shoulder and upper arm, initial encounter: Secondary | ICD-10-CM

## 2022-04-15 DIAGNOSIS — S8992XA Unspecified injury of left lower leg, initial encounter: Secondary | ICD-10-CM

## 2022-04-15 NOTE — Telephone Encounter (Signed)
Orthopedic referral has been pended for your approval.

## 2022-04-17 ENCOUNTER — Ambulatory Visit: Payer: Self-pay | Admitting: Gastroenterology

## 2022-04-23 ENCOUNTER — Ambulatory Visit: Payer: No Typology Code available for payment source | Admitting: Anesthesiology

## 2022-04-23 ENCOUNTER — Encounter: Payer: Self-pay | Admitting: Gastroenterology

## 2022-04-23 ENCOUNTER — Encounter
Admission: RE | Disposition: A | Discharge status: Home / Self Care | Payer: Self-pay | Source: Home / Self Care | Attending: Gastroenterology

## 2022-04-23 ENCOUNTER — Ambulatory Visit
Admission: RE | Admit: 2022-04-23 | Discharge: 2022-04-23 | Disposition: A | Discharge status: Home / Self Care | Payer: No Typology Code available for payment source | Attending: Gastroenterology | Admitting: Gastroenterology

## 2022-04-23 DIAGNOSIS — D125 Benign neoplasm of sigmoid colon: Secondary | ICD-10-CM | POA: Insufficient documentation

## 2022-04-23 DIAGNOSIS — K635 Polyp of colon: Secondary | ICD-10-CM

## 2022-04-23 DIAGNOSIS — K5730 Diverticulosis of large intestine without perforation or abscess without bleeding: Secondary | ICD-10-CM | POA: Insufficient documentation

## 2022-04-23 DIAGNOSIS — Z1211 Encounter for screening for malignant neoplasm of colon: Secondary | ICD-10-CM | POA: Diagnosis present

## 2022-04-23 DIAGNOSIS — E039 Hypothyroidism, unspecified: Secondary | ICD-10-CM | POA: Insufficient documentation

## 2022-04-23 DIAGNOSIS — Z86010 Personal history of colonic polyps: Secondary | ICD-10-CM | POA: Insufficient documentation

## 2022-04-23 DIAGNOSIS — Z85828 Personal history of other malignant neoplasm of skin: Secondary | ICD-10-CM | POA: Diagnosis not present

## 2022-04-23 DIAGNOSIS — K640 First degree hemorrhoids: Secondary | ICD-10-CM | POA: Diagnosis not present

## 2022-04-23 HISTORY — PX: COLONOSCOPY WITH PROPOFOL: SHX5780

## 2022-04-23 HISTORY — DX: Hypothyroidism, unspecified: E03.9

## 2022-04-23 SURGERY — COLONOSCOPY WITH PROPOFOL
Anesthesia: General

## 2022-04-23 MED ORDER — LIDOCAINE HCL (CARDIAC) PF 100 MG/5ML IV SOSY
PREFILLED_SYRINGE | INTRAVENOUS | Status: DC | PRN
  Administered 2022-04-23: 100 mg via INTRATRACHEAL

## 2022-04-23 MED ORDER — PROPOFOL 1000 MG/100ML IV EMUL
INTRAVENOUS | Status: AC
  Filled 2022-04-23: qty 500

## 2022-04-23 MED ORDER — SODIUM CHLORIDE 0.9 % IV SOLN: INTRAVENOUS | Status: DC

## 2022-04-23 MED ORDER — PROPOFOL 10 MG/ML IV BOLUS
INTRAVENOUS | Status: DC | PRN
  Administered 2022-04-23: 50 mg via INTRAVENOUS
  Administered 2022-04-23: 20 mg via INTRAVENOUS
  Administered 2022-04-23: 10 mg via INTRAVENOUS

## 2022-04-23 MED ORDER — PROPOFOL 500 MG/50ML IV EMUL
INTRAVENOUS | Status: DC | PRN
  Administered 2022-04-23: 155 ug/kg/min via INTRAVENOUS

## 2022-04-23 MED ORDER — PROPOFOL 10 MG/ML IV BOLUS
INTRAVENOUS | Status: AC
  Filled 2022-04-23: qty 20

## 2022-04-23 NOTE — Anesthesia Postprocedure Evaluation (Signed)
Anesthesia Post Note  Patient: Ludene A Fikes  Procedure(s) Performed: COLONOSCOPY WITH PROPOFOL  Patient location during evaluation: Endoscopy Anesthesia Type: General Level of consciousness: awake and alert Pain management: pain level controlled Vital Signs Assessment: post-procedure vital signs reviewed and stable Respiratory status: spontaneous breathing, nonlabored ventilation, respiratory function stable and patient connected to nasal cannula oxygen Cardiovascular status: blood pressure returned to baseline and stable Postop Assessment: no apparent nausea or vomiting Anesthetic complications: no   No notable events documented.   Last Vitals:  Vitals:   04/23/22 0820 04/23/22 0829  BP: 91/67 96/62  Pulse: (!) 56 61  Resp: (!) 9 12  Temp:    SpO2: 98% 99%    Last Pain:  Vitals:   04/23/22 0759  TempSrc: Temporal  PainSc:                  Precious Haws Mitsuo Budnick

## 2022-04-23 NOTE — Transfer of Care (Signed)
Immediate Anesthesia Transfer of Care Note  Patient: Kristina Barnes  Procedure(s) Performed: COLONOSCOPY WITH PROPOFOL  Patient Location: Endoscopy Unit  Anesthesia Type:General  Level of Consciousness: drowsy and patient cooperative  Airway & Oxygen Therapy: Patient Spontanous Breathing and Patient connected to face mask oxygen  Post-op Assessment: Report given to RN and Post -op Vital signs reviewed and stable  Post vital signs: Reviewed and stable  Last Vitals:  Vitals Value Taken Time  BP 96/62 04/23/22 0829  Temp 35.4 C 04/23/22 0759  Pulse 58 04/23/22 0829  Resp 20 04/23/22 0829  SpO2 100 % 04/23/22 0829  Vitals shown include unvalidated device data.  Last Pain:  Vitals:   04/23/22 0759  TempSrc: Temporal  PainSc:          Complications: No notable events documented.

## 2022-04-23 NOTE — Anesthesia Procedure Notes (Signed)
Procedure Name: General with mask airway Date/Time: 04/23/2022 8:01 AM  Performed by: Kelton Pillar, CRNAPre-anesthesia Checklist: Patient identified, Emergency Drugs available, Suction available and Patient being monitored Patient Re-evaluated:Patient Re-evaluated prior to induction Oxygen Delivery Method: Simple face mask Induction Type: IV induction Placement Confirmation: positive ETCO2, CO2 detector and breath sounds checked- equal and bilateral Dental Injury: Teeth and Oropharynx as per pre-operative assessment

## 2022-04-23 NOTE — Anesthesia Preprocedure Evaluation (Signed)
Anesthesia Evaluation  Patient identified by MRN, date of birth, ID band Patient awake    Reviewed: Allergy & Precautions, NPO status , Patient's Chart, lab work & pertinent test results  History of Anesthesia Complications Negative for: history of anesthetic complications  Airway Mallampati: III  TM Distance: <3 FB Neck ROM: full    Dental  (+) Chipped   Pulmonary neg pulmonary ROS, neg shortness of breath,    Pulmonary exam normal        Cardiovascular Exercise Tolerance: Good (-) angina(-) Past MI negative cardio ROS Normal cardiovascular exam     Neuro/Psych  Headaches, negative psych ROS   GI/Hepatic negative GI ROS, Neg liver ROS, neg GERD  ,  Endo/Other  Hypothyroidism   Renal/GU negative Renal ROS  negative genitourinary   Musculoskeletal   Abdominal   Peds  Hematology negative hematology ROS (+)   Anesthesia Other Findings Past Medical History: No date: Allergy No date: Hypothyroidism No date: Menopause syndrome 1981: Migraine No date: Sensory neuropathy 2015: Skin cancer     Comment:  basal cell at left infraorbital No date: Thyroid disease  Past Surgical History: No date: APPENDECTOMY 03/25/2017: COLONOSCOPY WITH PROPOFOL; N/A     Comment:  Procedure: COLONOSCOPY WITH PROPOFOL;  Surgeon: Lucilla Lame, MD;  Location: [facility] ENDOSCOPY;  Service:               Endoscopy;  Laterality: N/A;  BMI    Body Mass Index: 21.46 kg/m      Reproductive/Obstetrics negative OB ROS                             Anesthesia Physical Anesthesia Plan  ASA: 2  Anesthesia Plan: General   Post-op Pain Management:    Induction: Intravenous  PONV Risk Score and Plan: Propofol infusion and TIVA  Airway Management Planned: Natural Airway and Nasal Cannula  Additional Equipment:   Intra-op Plan:   Post-operative Plan:   Informed Consent: I have reviewed the  patients History and Physical, chart, labs and discussed the procedure including the risks, benefits and alternatives for the proposed anesthesia with the patient or authorized representative who has indicated his/her understanding and acceptance.     Dental Advisory Given  Plan Discussed with: Anesthesiologist, CRNA and Surgeon  Anesthesia Plan Comments: (Patient consented for risks of anesthesia including but not limited to:  - adverse reactions to medications - risk of airway placement if required - damage to eyes, teeth, lips or other oral mucosa - nerve damage due to positioning  - sore throat or hoarseness - Damage to heart, brain, nerves, lungs, other parts of body or loss of life  Patient voiced understanding.)        Anesthesia Quick Evaluation

## 2022-04-23 NOTE — H&P (Signed)
Lucilla Lame, MD [address] [place], [place] [address] [phone] Fax : [phone]  Primary Care Physician:  Crecencio Mc, MD Primary Gastroenterologist:  Dr. Allen Norris  Pre-Procedure History & Physical: HPI:  Kristina Barnes is a 66 y.o. female is here for an colonoscopy.   Past Medical History:  Diagnosis Date   Allergy    Hypothyroidism    Menopause syndrome    Migraine 1981   Sensory neuropathy    Skin cancer 2015   basal cell at left infraorbital   Thyroid disease     Past Surgical History:  Procedure Laterality Date   APPENDECTOMY     COLONOSCOPY WITH PROPOFOL N/A 03/25/2017   Procedure: COLONOSCOPY WITH PROPOFOL;  Surgeon: Lucilla Lame, MD;  Location: [facility] ENDOSCOPY;  Service: Endoscopy;  Laterality: N/A;    Prior to Admission medications   Medication Sig Start Date End Date Taking? Authorizing Provider  FLUoxetine (PROZAC) 10 MG capsule Take 3 capsules (30 mg total) by mouth daily. 10/16/21  Yes Crecencio Mc, MD  albuterol (VENTOLIN HFA) 108 (90 Base) MCG/ACT inhaler 2 puff as directed every four to six hours as needed 02/28/22     Cholecalciferol (VITAMIN D3) 1000 UNITS CAPS Take 1 capsule by mouth daily. Reported on 05/03/2016 Patient not taking: Reported on 11/12/2021    [provider]  fish oil-omega-3 fatty acids 1000 MG capsule Take 1 g by mouth at bedtime. Reported on 05/03/2016 Patient not taking: Reported on 11/12/2021    [provider]  fluorouracil (EFUDEX) 5 % cream Apply to the nose BID x 7 days. 11/12/21   Ralene Bathe, MD  fluticasone (FLONASE) 50 MCG/ACT nasal spray Place 2 sprays into both nostrils daily. 12/17/19   Crecencio Mc, MD  levothyroxine (SYNTHROID) 75 MCG tablet TAKE 1 TABLET BY MOUTH DAILY BEFORE BREAKFAST. 11/28/21 11/28/22  Crecencio Mc, MD  loratadine (CLARITIN) 10 MG tablet Take 1 tablet (10 mg total) by mouth at bedtime. 02/05/13   Crecencio Mc, MD  Lysine 500 MG TABS Take 1  tablet (500 mg total) by mouth at bedtime. 02/05/13   Crecencio Mc, MD  Magnesium 400 MG TABS Take 400 mg by mouth daily.    [provider]  MODERNA COVID-19 BIVAL BOOSTER 50 MCG/0.5ML injection  09/14/21   [provider]  Multiple Vitamin (MULTIVITAMIN) tablet Take 1 tablet by mouth daily.    [provider]  TURMERIC PO Take 1 capsule by mouth daily.  Patient not taking: Reported on 11/12/2021    [provider]    Allergies as of 02/13/2022 - Review Complete 02/13/2022  Allergen Reaction Noted   Penicillins Rash 02/03/2013   Sulfa antibiotics Rash 02/03/2013    Family History  Problem Relation Age of Onset   Arthritis Mother        rheumatoid   Hypertension Mother    Ovarian cancer Mother 85   Osteoporosis Mother    Cancer Father    Depression Sister    Pulmonary embolism Sister    Alcohol abuse Sister    Alcohol abuse Brother    Breast cancer Neg Hx     Social History   Socioeconomic History   Marital status: Married    Spouse name: Not on file   Number of children: Not on file   Years of education: Not on file   Highest education level: Not on file  Occupational History   Occupation: [employer]  Tobacco Use  Smoking status: Never   Smokeless tobacco: Never  Vaping Use   Vaping Use: Not on file  Substance and Sexual Activity   Alcohol use: Yes    Alcohol/week: 7.0 standard drinks of alcohol    Types: 7 Standard drinks or equivalent per week    Comment: Fri/Sat night (7 drinks per week)   Drug use: No   Sexual activity: Yes    Birth control/protection: Post-menopausal  Other Topics Concern   Not on file  Social History Narrative   Lives with husband   Caffeine use: Coffee daily   Right handed    Social Determinants of Health   Financial Resource Strain: Not on file  Food Insecurity: Not on file  Transportation Needs: Not on file  Physical Activity: Not on file  Stress: Not on file  Social Connections: Not on  file  Intimate Partner Violence: Not on file    Review of Systems: See HPI, otherwise negative ROS  Physical Exam: BP 114/73   Pulse 77   Temp (!) 96.3 F (35.7 C) (Temporal)   Resp 18   Ht 5' 6.5" (1.689 m)   Wt 61.2 kg   LMP 08/19/2012   SpO2 96%   BMI 21.46 kg/m  General:   Alert,  pleasant and cooperative in NAD Head:  Normocephalic and atraumatic. Neck:  Supple; no masses or thyromegaly. Lungs:  Clear throughout to auscultation.    Heart:  Regular rate and rhythm. Abdomen:  Soft, nontender and nondistended. Normal bowel sounds, without guarding, and without rebound.   Neurologic:  Alert and  oriented x4;  grossly normal neurologically.  Impression/Plan: Kristina Barnes is here for an colonoscopy to be performed for a history of adenomatous polyps on 2018  Risks, benefits, limitations, and alternatives regarding  colonoscopy have been reviewed with the patient.  Questions have been answered.  All parties agreeable.   Lucilla Lame, MD  04/23/2022, 7:24 AM

## 2022-04-23 NOTE — Op Note (Signed)
[facility] Gastroenterology Patient Name: Kristina Barnes Procedure Date: 04/23/2022 7:31 AM MRN: [id number] Account #: [id number] Date of Birth: [birth year] Admit Type: Outpatient Age: 66 Room: [facility] ENDO ROOM 4 Gender: Female Note Status: Finalized Instrument Name: Jasper Riling [id number] Procedure:             Colonoscopy Indications:           High risk colon cancer surveillance: Personal history                         of colonic polyps Providers:             Lucilla Lame MD, MD Medicines:             Propofol per Anesthesia Complications:         No immediate complications. Procedure:             Pre-Anesthesia Assessment:                        - Prior to the procedure, a History and Physical was                         performed, and patient medications and allergies were                         reviewed. The patient's tolerance of previous                         anesthesia was also reviewed. The risks and benefits                         of the procedure and the sedation options and risks                         were discussed with the patient. All questions were                         answered, and informed consent was obtained. Prior                         Anticoagulants: The patient has taken no previous                         anticoagulant or antiplatelet agents. ASA Grade                         Assessment: II - A patient with mild systemic disease.                         After reviewing the risks and benefits, the patient                         was deemed in satisfactory condition to undergo the                         procedure.                        After obtaining informed consent, the colonoscope was  passed under direct vision. Throughout the procedure,                         the patient's blood pressure, pulse, and oxygen                         saturations were monitored continuously. The                          Colonoscope was introduced through the anus and                         advanced to the the cecum, identified by appendiceal                         orifice and ileocecal valve. The colonoscopy was                         performed without difficulty. The patient tolerated                         the procedure well. The quality of the bowel                         preparation was excellent. Findings:      The perianal and digital rectal examinations were normal.      A 4 mm polyp was found in the sigmoid colon. The polyp was sessile. The       polyp was removed with a cold biopsy forceps. Resection and retrieval       were complete.      A few small-mouthed diverticula were found in the sigmoid colon and       descending colon.      Non-bleeding internal hemorrhoids were found during retroflexion. The       hemorrhoids were Grade I (internal hemorrhoids that do not prolapse). Impression:            - One 4 mm polyp in the sigmoid colon, removed with a                         cold biopsy forceps. Resected and retrieved.                        - Diverticulosis in the sigmoid colon and in the                         descending colon.                        - Non-bleeding internal hemorrhoids. Recommendation:        - Discharge patient to home.                        - Resume previous diet.                        - Continue present medications.                        - Await pathology results.                        -  Repeat colonoscopy in 7 years for surveillance. Procedure Code(s):     --- Professional ---                        [phone], Colonoscopy, flexible; with biopsy, single or                         multiple Diagnosis Code(s):     --- Professional ---                        Z86.010, Personal history of colonic polyps                        K63.5, Polyp of colon CPT copyright 2019 American Medical Association. All rights reserved. The codes documented in this report are preliminary  and upon coder review may  be revised to meet current compliance requirements. Lucilla Lame MD, MD 04/23/2022 7:57:25 AM This report has been signed electronically. Number of Addenda: 0 Note Initiated On: 04/23/2022 7:31 AM Scope Withdrawal Time: 0 hours 10 minutes 54 seconds  Total Procedure Duration: 0 hours 19 minutes 49 seconds  Estimated Blood Loss:  Estimated blood loss: none.      [facility]

## 2022-04-24 ENCOUNTER — Encounter: Payer: Self-pay | Admitting: Gastroenterology

## 2022-04-24 LAB — SURGICAL PATHOLOGY

## 2022-04-25 ENCOUNTER — Encounter: Payer: Self-pay | Admitting: Gastroenterology

## 2022-04-30 ENCOUNTER — Telehealth: Payer: Self-pay | Admitting: Gastroenterology

## 2022-04-30 NOTE — Telephone Encounter (Signed)
  Pt medical records for procedure 04/23/22 Were faxed to [place] 04/30/2022

## 2022-05-01 ENCOUNTER — Other Ambulatory Visit: Payer: Self-pay

## 2022-05-01 ENCOUNTER — Ambulatory Visit (INDEPENDENT_AMBULATORY_CARE_PROVIDER_SITE_OTHER): Payer: No Typology Code available for payment source | Admitting: Internal Medicine

## 2022-05-01 ENCOUNTER — Encounter: Payer: Self-pay | Admitting: Internal Medicine

## 2022-05-01 VITALS — BP 92/58 | HR 66 | Temp 98.0°F | Ht 66.5 in | Wt 139.8 lb

## 2022-05-01 DIAGNOSIS — Z0000 Encounter for general adult medical examination without abnormal findings: Secondary | ICD-10-CM | POA: Diagnosis not present

## 2022-05-01 DIAGNOSIS — E785 Hyperlipidemia, unspecified: Secondary | ICD-10-CM

## 2022-05-01 DIAGNOSIS — Z780 Asymptomatic menopausal state: Secondary | ICD-10-CM

## 2022-05-01 DIAGNOSIS — Z1231 Encounter for screening mammogram for malignant neoplasm of breast: Secondary | ICD-10-CM

## 2022-05-01 DIAGNOSIS — E559 Vitamin D deficiency, unspecified: Secondary | ICD-10-CM

## 2022-05-01 MED ORDER — ZOSTER VAC RECOMB ADJUVANTED 50 MCG/0.5ML IM SUSR: 0.5000 mL | Freq: Once | INTRAMUSCULAR | 0 refills | Status: AC

## 2022-05-01 MED ORDER — TIZANIDINE HCL 4 MG PO TABS
4.0000 mg | ORAL_TABLET | Freq: Four times a day (QID) | ORAL | 0 refills | Status: DC | PRN
  Filled 2022-05-01 (×2): qty 30, 8d supply, fill #0

## 2022-05-01 NOTE — Patient Instructions (Signed)
Good to see you   

## 2022-05-01 NOTE — Assessment & Plan Note (Signed)

## 2022-05-01 NOTE — Progress Notes (Signed)
The patient is here for annual preventive examination and management of other chronic and acute problems.   The risk factors are reflected in the social history.   The roster of all physicians providing medical care to patient - is listed in the Snapshot section of the chart.   Activities of daily living:  The patient is 100% independent in all ADLs: dressing, toileting, feeding as well as independent mobility   Home safety : The patient has smoke detectors in the home. They wear seatbelts.  There are no unsecured firearms at home. There is no violence in the home.    There is no risks for hepatitis, STDs or HIV. There is no   history of blood transfusion. They have no travel history to infectious disease endemic areas of the world.   The patient has seen their dentist in the last six month. They have seen their eye doctor in the last year. The patinet  denies slight hearing difficulty with regard to whispered voices and some television programs.  They have deferred audiologic testing in the last year.  They do not  have excessive sun exposure. Discussed the need for sun protection: hats, long sleeves and use of sunscreen if there is significant sun exposure.    Diet: the importance of a healthy diet is discussed. They do have a healthy diet.   The benefits of regular aerobic exercise were discussed. The patient  exercises  3 to 5 days per week  for  60 minutes.    Depression screen: there are no signs or vegative symptoms of depression- irritability, change in appetite, anhedonia, sadness/tearfullness.   The following portions of the patient's history were reviewed and updated as appropriate: allergies, current medications, past family history, past medical history,  past surgical history, past social history  and problem list.   Visual acuity was not assessed per patient preference since the patient has regular follow up with an  ophthalmologist. Hearing and body mass index were assessed and  reviewed.    During the course of the visit the patient was educated and counseled about appropriate screening and preventive services including : fall prevention , diabetes screening, nutrition counseling, colorectal cancer screening, and recommended immunizations.    Chief Complaint:   1) Left shoulder injury from a  fall during an expedition cruise.   Persistent pain, has full ROM but pain with abduction.  . Left knee also tweaked form hiking,  but deferred steroid shot from [facility]    Review of Symptoms  Patient denies headache, fevers, malaise, unintentional weight loss, skin rash, eye pain, sinus congestion and sinus pain, sore throat, dysphagia,  hemoptysis , cough, dyspnea, wheezing, chest pain, palpitations, orthopnea, edema, abdominal pain, nausea, melena, diarrhea, constipation, flank pain, dysuria, hematuria, urinary  Frequency, nocturia, numbness, tingling, seizures,  Focal weakness, Loss of consciousness,  Tremor, insomnia, depression, anxiety, and suicidal ideation.    Physical Exam:  BP (!) 92/58 (BP Location: Left Arm, Patient Position: Sitting, Cuff Size: Normal)   Pulse 66   Temp 98 F (36.7 C) (Oral)   Ht 5' 6.5" (1.689 m)   Wt 139 lb 12.8 oz (63.4 kg)   LMP 08/19/2012   SpO2 97%   BMI 22.23 kg/m     General appearance: alert, cooperative and appears stated age Head: Normocephalic, without obvious abnormality, atraumatic Eyes: conjunctivae/corneas clear. PERRL, EOM's intact. Fundi benign. Ears: normal TM's and external ear canals both ears Nose: Nares normal. Septum midline. Mucosa normal. No drainage or  sinus tenderness. Throat: lips, mucosa, and tongue normal; teeth and gums normal Neck: no adenopathy, no carotid bruit, no JVD, supple, symmetrical, trachea midline and thyroid not enlarged, symmetric, no tenderness/mass/nodules Lungs: clear to auscultation bilaterally Breasts: normal appearance, no masses or tenderness Heart: regular rate and rhythm, S1,  S2 normal, no murmur, click, rub or gallop Abdomen: soft, non-tender; bowel sounds normal; no masses,  no organomegaly Extremities: extremities normal, atraumatic, no cyanosis or edema Pulses: 2+ and symmetric Skin: Skin color, texture, turgor normal. No rashes or lesions Neurologic: Alert and oriented X 3, normal strength and tone. Normal symmetric reflexes. Normal coordination and gait.    Assessment and Plan:  Encounter for preventive health examination age appropriate education and counseling updated, referrals for preventative services and immunizations addressed, dietary and smoking counseling addressed, most recent labs reviewed.  I have personally reviewed and have noted:   1) the patient's medical and social history 2) The pt's use of alcohol, tobacco, and illicit drugs 3) The patient's current medications and supplements 4) Functional ability including ADL's, fall risk, home safety risk, hearing and visual impairment 5) Diet and physical activities 6) Evidence for depression or mood disorder 7) The patient's height, weight, and BMI have been recorded in the chart   I have made referrals, and provided counseling and education based on review of the above  Vitamin D deficiency Repeat level done.    I provided 40 minutes  during this encounter reviewing patient's current problems and past surgeries, labs and imaging studies, providing counseling on the above mentioned problems I na face to face visit  , and coordination  of care .   Updated Medication List Outpatient Encounter Medications as of 05/01/2022  Medication Sig   albuterol (VENTOLIN HFA) 108 (90 Base) MCG/ACT inhaler 2 puff as directed every four to six hours as needed   fluorouracil (EFUDEX) 5 % cream Apply to the nose BID x 7 days.   FLUoxetine (PROZAC) 10 MG capsule Take 3 capsules (30 mg total) by mouth daily. (Patient taking differently: Take 20 mg by mouth daily.)   fluticasone (FLONASE) 50 MCG/ACT nasal spray  Place 2 sprays into both nostrils daily.   levothyroxine (SYNTHROID) 75 MCG tablet TAKE 1 TABLET BY MOUTH DAILY BEFORE BREAKFAST.   loratadine (CLARITIN) 10 MG tablet Take 1 tablet (10 mg total) by mouth at bedtime.   Magnesium 400 MG TABS Take 400 mg by mouth daily.   Multiple Vitamin (MULTIVITAMIN) tablet Take 1 tablet by mouth daily.   tiZANidine (ZANAFLEX) 4 MG tablet Take 1 tablet (4 mg total) by mouth every 6 (six) hours as needed for muscle spasms.   TURMERIC PO Take 1 capsule by mouth daily.   Zoster Vaccine Adjuvanted [facility]) injection Inject 0.5 mLs into the muscle once for 1 dose.   [DISCONTINUED] Cholecalciferol (VITAMIN D3) 1000 UNITS CAPS Take 1 capsule by mouth daily. Reported on 05/03/2016 (Patient not taking: Reported on 11/12/2021)   [DISCONTINUED] fish oil-omega-3 fatty acids 1000 MG capsule Take 1 g by mouth at bedtime. Reported on 05/03/2016 (Patient not taking: Reported on 11/12/2021)   [DISCONTINUED] Lysine 500 MG TABS Take 1 tablet (500 mg total) by mouth at bedtime. (Patient not taking: Reported on 05/01/2022)   [DISCONTINUED] MODERNA COVID-19 BIVAL BOOSTER 50 MCG/0.5ML injection    No facility-administered encounter medications on file as of 05/01/2022.

## 2022-05-01 NOTE — Assessment & Plan Note (Signed)
Repeat level done.

## 2022-05-23 ENCOUNTER — Other Ambulatory Visit: Payer: Self-pay | Admitting: Internal Medicine

## 2022-05-23 ENCOUNTER — Other Ambulatory Visit: Payer: Self-pay

## 2022-05-23 MED ORDER — LEVOTHYROXINE SODIUM 75 MCG PO TABS
ORAL_TABLET | Freq: Every day | ORAL | 1 refills | Status: DC
Start: 2022-05-23 — End: 2022-11-24
  Filled 2022-05-23: qty 90, 90d supply, fill #0
  Filled 2022-08-30: qty 90, 90d supply, fill #1

## 2022-06-11 ENCOUNTER — Ambulatory Visit
Admission: RE | Admit: 2022-06-11 | Discharge: 2022-06-11 | Disposition: A | Discharge status: Home / Self Care | Payer: No Typology Code available for payment source | Source: Ambulatory Visit | Attending: Internal Medicine | Admitting: Internal Medicine

## 2022-06-11 DIAGNOSIS — Z780 Asymptomatic menopausal state: Secondary | ICD-10-CM | POA: Insufficient documentation

## 2022-06-11 DIAGNOSIS — Z1231 Encounter for screening mammogram for malignant neoplasm of breast: Secondary | ICD-10-CM | POA: Insufficient documentation

## 2022-06-11 DIAGNOSIS — M85852 Other specified disorders of bone density and structure, left thigh: Secondary | ICD-10-CM | POA: Diagnosis present

## 2022-06-12 ENCOUNTER — Encounter: Payer: Self-pay | Admitting: Internal Medicine

## 2022-06-12 DIAGNOSIS — M85859 Other specified disorders of bone density and structure, unspecified thigh: Secondary | ICD-10-CM | POA: Insufficient documentation

## 2022-06-13 ENCOUNTER — Encounter: Payer: Self-pay | Admitting: Internal Medicine

## 2022-06-17 ENCOUNTER — Other Ambulatory Visit: Payer: Self-pay

## 2022-06-17 MED ORDER — RALOXIFENE HCL 60 MG PO TABS
60.0000 mg | ORAL_TABLET | Freq: Every day | ORAL | 1 refills | Status: DC
  Filled 2022-06-17: qty 90, 90d supply, fill #0

## 2022-06-17 NOTE — Addendum Note (Signed)
Addended by: Crecencio Mc on: 06/17/2022 10:03 PM   Modules accepted: Orders

## 2022-06-18 ENCOUNTER — Other Ambulatory Visit: Payer: Self-pay

## 2022-06-24 ENCOUNTER — Other Ambulatory Visit: Payer: Self-pay | Admitting: Family Medicine

## 2022-07-03 ENCOUNTER — Other Ambulatory Visit: Payer: Self-pay | Admitting: Family Medicine

## 2022-07-29 ENCOUNTER — Other Ambulatory Visit: Payer: Self-pay

## 2022-07-29 ENCOUNTER — Other Ambulatory Visit: Payer: Self-pay | Admitting: Internal Medicine

## 2022-07-29 ENCOUNTER — Encounter: Payer: Self-pay | Admitting: Internal Medicine

## 2022-07-29 MED ORDER — FLUOXETINE HCL 10 MG PO CAPS
30.0000 mg | ORAL_CAPSULE | Freq: Every day | ORAL | 1 refills | Status: DC
  Filled 2022-07-29: qty 261, 87d supply, fill #0
  Filled 2022-07-30: qty 9, 3d supply, fill #0
  Filled 2022-10-28: qty 270, 90d supply, fill #1

## 2022-07-30 ENCOUNTER — Other Ambulatory Visit: Payer: Self-pay

## 2022-07-30 ENCOUNTER — Other Ambulatory Visit: Payer: Self-pay | Admitting: Internal Medicine

## 2022-07-30 DIAGNOSIS — M85852 Other specified disorders of bone density and structure, left thigh: Secondary | ICD-10-CM

## 2022-07-30 MED ORDER — ALENDRONATE SODIUM 70 MG PO TABS
70.0000 mg | ORAL_TABLET | ORAL | 3 refills | Status: DC
  Filled 2022-07-30: qty 12, 84d supply, fill #0
  Filled 2022-10-28: qty 12, 84d supply, fill #1
  Filled 2023-03-31: qty 12, 84d supply, fill #2
  Filled 2023-06-29: qty 12, 84d supply, fill #3

## 2022-07-30 NOTE — Assessment & Plan Note (Signed)
Changing to alendronate from Evista due to intolerance due to increased symptoms of depression

## 2022-07-31 ENCOUNTER — Other Ambulatory Visit: Payer: Self-pay

## 2022-08-30 ENCOUNTER — Other Ambulatory Visit: Payer: Self-pay

## 2022-10-25 DIAGNOSIS — H4910 Fourth [trochlear] nerve palsy, unspecified eye: Secondary | ICD-10-CM | POA: Diagnosis not present

## 2022-10-25 DIAGNOSIS — H532 Diplopia: Secondary | ICD-10-CM | POA: Diagnosis not present

## 2022-10-25 DIAGNOSIS — H2513 Age-related nuclear cataract, bilateral: Secondary | ICD-10-CM | POA: Diagnosis not present

## 2022-10-25 DIAGNOSIS — H5005 Alternating esotropia: Secondary | ICD-10-CM | POA: Diagnosis not present

## 2022-10-28 ENCOUNTER — Other Ambulatory Visit: Payer: Self-pay

## 2022-11-13 ENCOUNTER — Ambulatory Visit (INDEPENDENT_AMBULATORY_CARE_PROVIDER_SITE_OTHER): Payer: 59 | Admitting: Dermatology

## 2022-11-13 ENCOUNTER — Encounter: Payer: Self-pay | Admitting: Dermatology

## 2022-11-13 VITALS — BP 95/63 | HR 66

## 2022-11-13 DIAGNOSIS — Z5111 Encounter for antineoplastic chemotherapy: Secondary | ICD-10-CM

## 2022-11-13 DIAGNOSIS — D229 Melanocytic nevi, unspecified: Secondary | ICD-10-CM

## 2022-11-13 DIAGNOSIS — L814 Other melanin hyperpigmentation: Secondary | ICD-10-CM | POA: Diagnosis not present

## 2022-11-13 DIAGNOSIS — L578 Other skin changes due to chronic exposure to nonionizing radiation: Secondary | ICD-10-CM | POA: Diagnosis not present

## 2022-11-13 DIAGNOSIS — Z1283 Encounter for screening for malignant neoplasm of skin: Secondary | ICD-10-CM

## 2022-11-13 DIAGNOSIS — D1801 Hemangioma of skin and subcutaneous tissue: Secondary | ICD-10-CM

## 2022-11-13 DIAGNOSIS — Z79899 Other long term (current) drug therapy: Secondary | ICD-10-CM

## 2022-11-13 DIAGNOSIS — L821 Other seborrheic keratosis: Secondary | ICD-10-CM

## 2022-11-13 DIAGNOSIS — L570 Actinic keratosis: Secondary | ICD-10-CM | POA: Diagnosis not present

## 2022-11-13 DIAGNOSIS — Z85828 Personal history of other malignant neoplasm of skin: Secondary | ICD-10-CM | POA: Diagnosis not present

## 2022-11-13 NOTE — Patient Instructions (Addendum)
Cryotherapy Aftercare  Wash gently with soap and water everyday.   Apply Vaseline and Band-Aid daily until healed.   A few weeks after the LN2 treatment to above right lateral brow, may start 5FU/Calcipotriene cream 2 times a day for 7 days.  Due to recent changes in healthcare laws, you may see results of your pathology and/or laboratory studies on MyChart before the doctors have had a chance to review them. We understand that in some cases there may be results that are confusing or concerning to you. Please understand that not all results are received at the same time and often the doctors may need to interpret multiple results in order to provide you with the best plan of care or course of treatment. Therefore, we ask that you please give [place] 2 business days to thoroughly review all your results before contacting the office for clarification. Should we see a critical lab result, you will be contacted sooner.   If You Need Anything After Your Visit  If you have any questions or concerns for your doctor, please call our main line at [phone] and press option 4 to reach your doctor's medical assistant. If no one answers, please leave a voicemail as directed and we will return your call as soon as possible. Messages left after 4 pm will be answered the following business day.   You may also send [place] a message via [place]. We typically respond to MyChart messages within 1-2 business days.  For prescription refills, please ask your pharmacy to contact our office. Our fax number is [phone].  If you have an urgent issue when the clinic is closed that cannot wait until the next business day, you can page your doctor at the number below.    Please note that while we do our best to be available for urgent issues outside of office hours, we are not available 24/7.   If you have an urgent issue and are unable to reach [place], you may choose to seek medical care at your doctor's office, retail clinic,  urgent care center, or emergency room.  If you have a medical emergency, please immediately call 911 or go to the emergency department.  Pager Numbers  - Dr. Nehemiah Massed: [phone]  - Dr. Laurence Ferrari: [phone]  - Dr. Nicole Kindred: [phone]  In the event of inclement weather, please call our main line at [phone] for an update on the status of any delays or closures.  Dermatology Medication Tips: Please keep the boxes that topical medications come in in order to help keep track of the instructions about where and how to use these. Pharmacies typically print the medication instructions only on the boxes and not directly on the medication tubes.   If your medication is too expensive, please contact our office at [phone] option 4 or send [place] a message through [place].   We are unable to tell what your co-pay for medications will be in advance as this is different depending on your insurance coverage. However, we may be able to find a substitute medication at lower cost or fill out paperwork to get insurance to cover a needed medication.   If a prior authorization is required to get your medication covered by your insurance company, please allow [place] 1-2 business days to complete this process.  Drug prices often vary depending on where the prescription is filled and some pharmacies may offer cheaper prices.  The website www.goodrx.com contains coupons for medications through different pharmacies. The prices here do not  account for what the cost may be with help from insurance (it may be cheaper with your insurance), but the website can give you the price if you did not use any insurance.  - You can print the associated coupon and take it with your prescription to the pharmacy.  - You may also stop by our office during regular business hours and pick up a GoodRx coupon card.  - If you need your prescription sent electronically to a different pharmacy, notify our office through [facility] or by phone at [phone] option 4.     Si Usted Necesita Algo Despus de Su Visita  Tambin puede enviarnos un mensaje a travs de Pharmacist, community. Por lo general respondemos a los mensajes de MyChart en el transcurso de 1 a 2 das hbiles.  Para renovar recetas, por favor pida a su farmacia que se ponga en contacto con nuestra oficina. Harland Dingwall de fax es [place] [phone].  Si tiene un asunto urgente cuando la clnica est cerrada y que no puede esperar hasta el siguiente da hbil, puede llamar/localizar a su doctor(a) al nmero que aparece a continuacin.   Por favor, tenga en cuenta que aunque hacemos todo lo posible para estar disponibles para asuntos urgentes fuera del horario de [place], no estamos disponibles las 24 horas del da, los 7 das de la [place].   Si tiene un problema urgente y no puede comunicarse con nosotros, puede optar por buscar atencin mdica  en el consultorio de su doctor(a), en una clnica privada, en un centro de atencin urgente o en una sala de emergencias.  Si tiene Engineering geologist, por favor llame inmediatamente al 911 o vaya a la sala de emergencias.  Nmeros de bper  - Dr. Nehemiah Massed: [phone]  - Dra. Moye: [phone]  - Dra. Nicole Kindred: [phone]  En caso de inclemencias del [place], por favor llame a Johnsie Kindred principal al [phone] para una actualizacin sobre el [place] de cualquier retraso o cierre.  Consejos para la medicacin en dermatologa: Por favor, guarde las cajas en las que vienen los medicamentos de uso tpico para ayudarle a seguir las instrucciones sobre dnde y cmo usarlos. Las farmacias generalmente imprimen las instrucciones del medicamento slo en las cajas y no directamente en los tubos del [place].   Si su medicamento es muy caro, por favor, pngase en contacto con Zigmund Daniel llamando al [phone] y presione la opcin 4 o envenos un mensaje a travs de Pharmacist, community.   No podemos decirle cul  ser su copago por los medicamentos por adelantado ya que esto es diferente dependiendo de la cobertura de su seguro. Sin embargo, es posible que podamos encontrar un medicamento sustituto a Electrical engineer un formulario para que el seguro cubra el medicamento que se considera necesario.   Si se requiere una autorizacin previa para que su compaa de seguros [place] su medicamento, por favor permtanos de 1 a 2 das hbiles para completar este proceso.  Los precios de los medicamentos varan con frecuencia dependiendo del Environmental consultant de dnde se surte la receta y alguna farmacias pueden ofrecer precios ms baratos.  El sitio web www.goodrx.com tiene cupones para medicamentos de Airline pilot. Los precios aqu no tienen en cuenta lo que podra costar con la ayuda del seguro (puede ser ms barato con su seguro), pero el sitio web puede darle el precio si no utiliz Research scientist (physical sciences).  - Puede imprimir el cupn correspondiente y llevarlo con su receta a la farmacia.  -  Tambin puede pasar por nuestra oficina durante el horario de atencin regular y Charity fundraiser una tarjeta de cupones de GoodRx.  - Si necesita que su receta se enve electrnicamente a una farmacia diferente, informe a nuestra oficina a travs de MyChart de [place] o por telfono llamando al [phone] y presione la opcin 4.

## 2022-11-13 NOTE — Progress Notes (Signed)
Follow-Up Visit   Subjective  Kristina Barnes is a 67 y.o. female who presents for the following: Total body skin exam (Hx of BCC L infraorbital, hx of AKs), Actinic Keratosis (1 yr f/u, pt used 5FU/Calcipotriene x 1 wk to nose, pt got a good reaction), and check spot (R eyebrow, ~43m scaly). The patient presents for Total-Body Skin Exam (TBSE) for skin cancer screening and mole check.  The patient has spots, moles and lesions to be evaluated, some may be new or changing and the patient has concerns that these could be cancer.   The following portions of the chart were reviewed this encounter and updated as appropriate:   Tobacco  Allergies  Meds  Problems  Med Hx  Surg Hx  Fam Hx     Review of Systems:  No other skin or systemic complaints except as noted in HPI or Assessment and Plan.  Objective  Well appearing patient in no apparent distress; mood and affect are within normal limits.  A full examination was performed including scalp, head, eyes, ears, nose, lips, neck, chest, axillae, abdomen, back, buttocks, bilateral upper extremities, bilateral lower extremities, hands, feet, fingers, toes, fingernails, and toenails. All findings within normal limits unless otherwise noted below.  above R lat brow x 1, R upper lip x 1 (2) Pink scaly macules  L upper back paraspinal Red pap      Assessment & Plan   Lentigines - Scattered tan macules - Due to sun exposure - Benign-appearing, observe - Recommend daily broad spectrum sunscreen SPF 30+ to sun-exposed areas, reapply every 2 hours as needed. - Call for any changes - upper back  Seborrheic Keratoses - Stuck-on, waxy, tan-brown papules and/or plaques  - Benign-appearing - Discussed benign etiology and prognosis. - Observe - Call for any changes - trunk  Melanocytic Nevi - Tan-brown and/or pink-flesh-colored symmetric macules and papules - Benign appearing on exam today - Observation - Call clinic for new or  changing moles - Recommend daily use of broad spectrum spf 30+ sunscreen to sun-exposed areas.  - trunk  Hemangiomas - Red papules - Discussed benign nature - Observe - Call for any changes - trunk  Actinic Damage - Chronic condition, secondary to cumulative UV/sun exposure - diffuse scaly erythematous macules with underlying dyspigmentation - Recommend daily broad spectrum sunscreen SPF 30+ to sun-exposed areas, reapply every 2 hours as needed.  - Staying in the shade or wearing long sleeves, sun glasses (UVA+UVB protection) and wide brim hats (4-inch brim around the entire circumference of the hat) are also recommended for sun protection.  - Call for new or changing lesions.  Skin cancer screening performed today.   History of Basal Cell Carcinoma of the Skin - No evidence of recurrence today - Recommend regular full body skin exams - Recommend daily broad spectrum sunscreen SPF 30+ to sun-exposed areas, reapply every 2 hours as needed.  - Call if any new or changing lesions are noted between office visits  - L infraorbital  AK (actinic keratosis) (2) above R lat brow x 1, R upper lip x 1 May start 5FU/Calcipotriene bid x 7 days to above R lat brow a few weeks after LN2 treatment Destruction of lesion - above R lat brow x 1, R upper lip x 1 Complexity: simple   Destruction method: cryotherapy   Informed consent: discussed and consent obtained   Timeout:  patient name, date of birth, surgical site, and procedure verified Lesion destroyed using liquid nitrogen: Yes  Region frozen until ice ball extended beyond lesion: Yes   Outcome: patient tolerated procedure well with no complications   Post-procedure details: wound care instructions given   Actinic Damage with PreCancerous Actinic Keratoses Counseling for Topical Chemotherapy Management: Patient exhibits: - Severe, confluent actinic changes with pre-cancerous actinic keratoses that is secondary to cumulative UV radiation  exposure over time - Condition that is severe; chronic, not at goal. - diffuse scaly erythematous macules and papules with underlying dyspigmentation - Discussed Prescription "Field Treatment" topical Chemotherapy for Severe, Chronic Confluent Actinic Changes with Pre-Cancerous Actinic Keratoses Field treatment involves treatment of an entire area of skin that has confluent Actinic Changes (Sun/ Ultraviolet light damage) and PreCancerous Actinic Keratoses by method of PhotoDynamic Therapy (PDT) and/or prescription Topical Chemotherapy agents such as 5-fluorouracil, 5-fluorouracil/calcipotriene, and/or imiquimod.  The purpose is to decrease the number of clinically evident and subclinical PreCancerous lesions to prevent progression to development of skin cancer by chemically destroying early precancer changes that may or may not be visible.  It has been shown to reduce the risk of developing skin cancer in the treated area. As a result of treatment, redness, scaling, crusting, and open sores may occur during treatment course. One or more than one of these methods may be used and may have to be used several times to control, suppress and eliminate the PreCancerous changes. Discussed treatment course, expected reaction, and possible side effects. - Recommend daily broad spectrum sunscreen SPF 30+ to sun-exposed areas, reapply every 2 hours as needed.  - Staying in the shade or wearing long sleeves, sun glasses (UVA+UVB protection) and wide brim hats (4-inch brim around the entire circumference of the hat) are also recommended. - Call for new or changing lesions.  Related Medications fluorouracil (EFUDEX) 5 % cream Apply to the nose BID x 7 days.  Hemangioma of skin by dermoscopy L upper back paraspinal See photo Benign-appearing.  Observation.  Call clinic for new or changing lesions.  Recommend daily use of broad spectrum spf 30+ sunscreen to sun-exposed areas.    Return in about 1 year (around  11/14/2023) for TBSE, Hx of BCC, Hx of AKs.  I, Othelia Pulling, RMA, am acting as scribe for Sarina Ser, MD . Documentation: I have reviewed the above documentation for accuracy and completeness, and I agree with the above.  Sarina Ser, MD

## 2022-11-16 ENCOUNTER — Encounter: Payer: Self-pay | Admitting: Dermatology

## 2022-11-24 ENCOUNTER — Other Ambulatory Visit: Payer: Self-pay | Admitting: Internal Medicine

## 2022-11-25 ENCOUNTER — Other Ambulatory Visit: Payer: Self-pay

## 2022-11-25 MED ORDER — LEVOTHYROXINE SODIUM 75 MCG PO TABS
75.0000 ug | ORAL_TABLET | Freq: Every day | ORAL | 11 refills | Status: DC
Start: 2022-11-25 — End: 2023-08-18
  Filled 2022-11-25: qty 30, 30d supply, fill #0
  Filled 2022-12-31: qty 90, 90d supply, fill #1
  Filled 2023-03-31: qty 90, 90d supply, fill #2
  Filled 2023-06-29: qty 90, 90d supply, fill #3

## 2022-12-06 DIAGNOSIS — H532 Diplopia: Secondary | ICD-10-CM | POA: Diagnosis not present

## 2022-12-31 ENCOUNTER — Other Ambulatory Visit: Payer: Self-pay

## 2022-12-31 LAB — LAB REPORT - SCANNED
A1c: 5.6
EGFR: 94

## 2023-01-06 ENCOUNTER — Other Ambulatory Visit: Payer: Self-pay | Admitting: Internal Medicine

## 2023-01-07 ENCOUNTER — Other Ambulatory Visit: Payer: Self-pay

## 2023-01-07 MED ORDER — FLUOXETINE HCL 10 MG PO CAPS
30.0000 mg | ORAL_CAPSULE | Freq: Every day | ORAL | 1 refills | Status: DC
  Filled 2023-01-07: qty 270, 90d supply, fill #0
  Filled 2023-05-05: qty 270, 90d supply, fill #1

## 2023-01-20 IMAGING — MG MM DIGITAL SCREENING BILAT W/ TOMO AND CAD
8 series · 8 of 24 positions shown · non-contrast
Comparison: Previous exam(s).

CLINICAL DATA: Screening.

EXAM:
DIGITAL SCREENING BILATERAL MAMMOGRAM WITH TOMOSYNTHESIS AND CAD
TECHNIQUE: Bilateral screening digital craniocaudal and mediolateral oblique
mammograms were obtained. Bilateral screening digital breast
tomosynthesis was performed. The images were evaluated with
computer-aided detection.

[R MLO synth-2D]
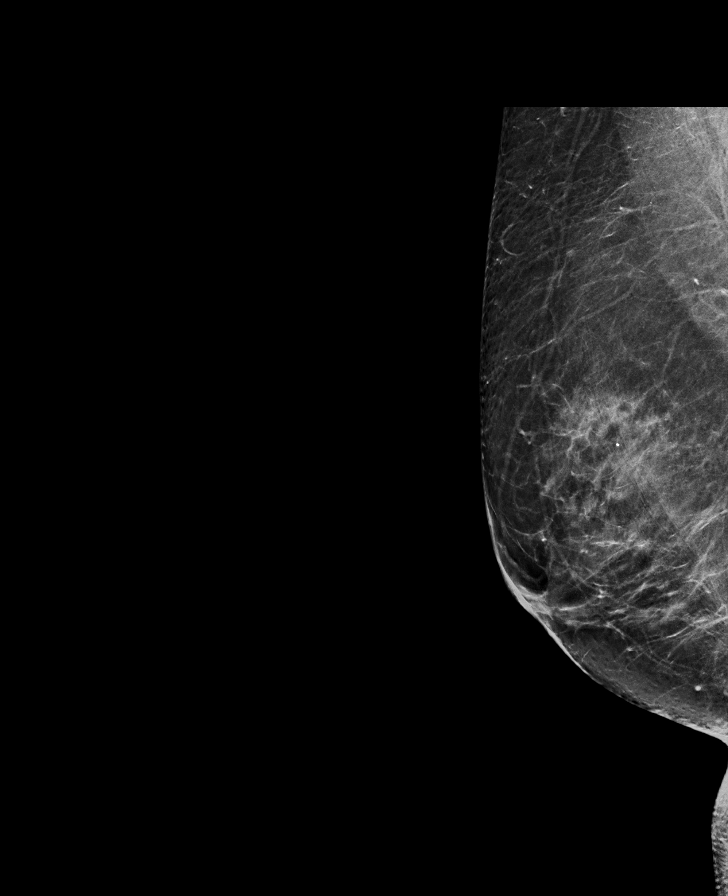

[L CC synth-2D]
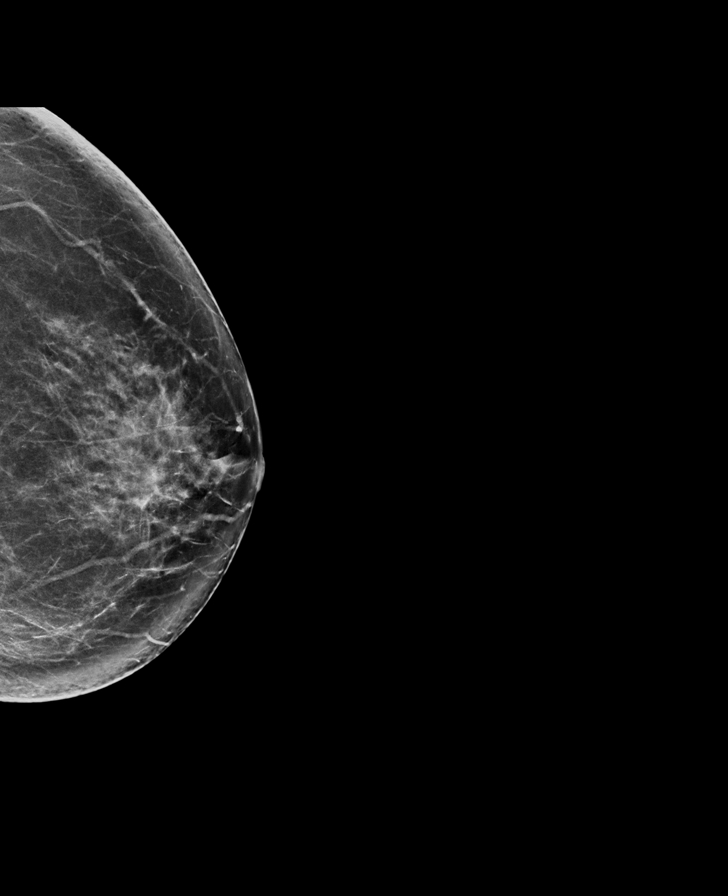

[L MLO synth-2D]
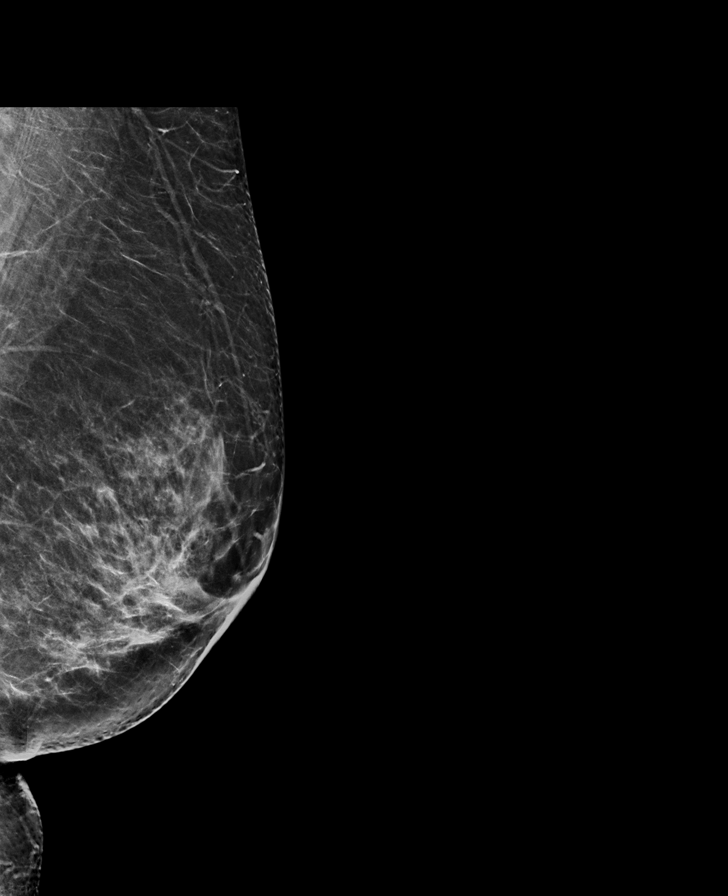

[R CC synth-2D]
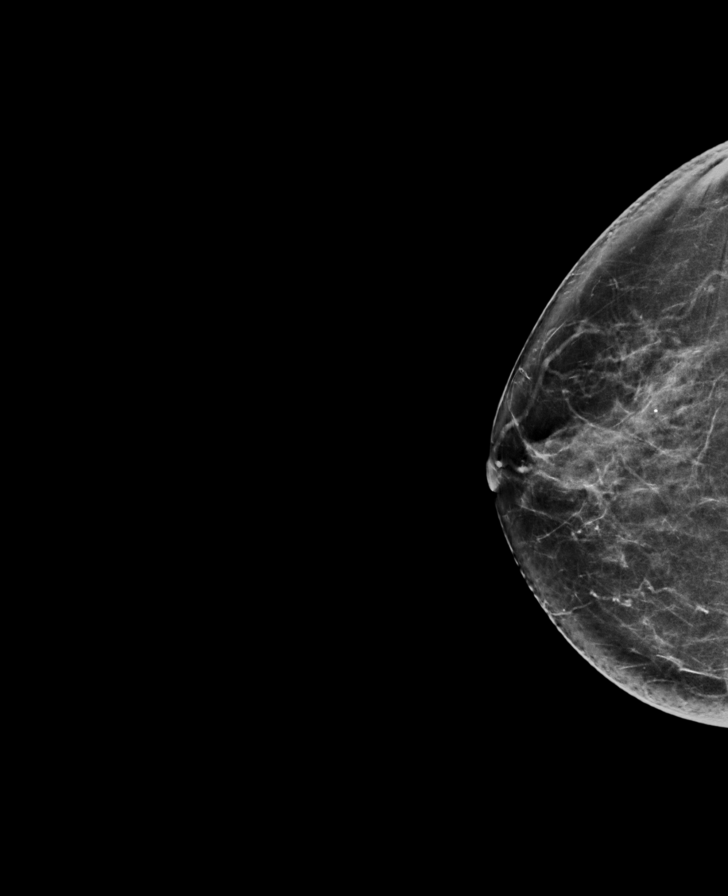

[L MLO tomo · tomo slice 35/70.0]
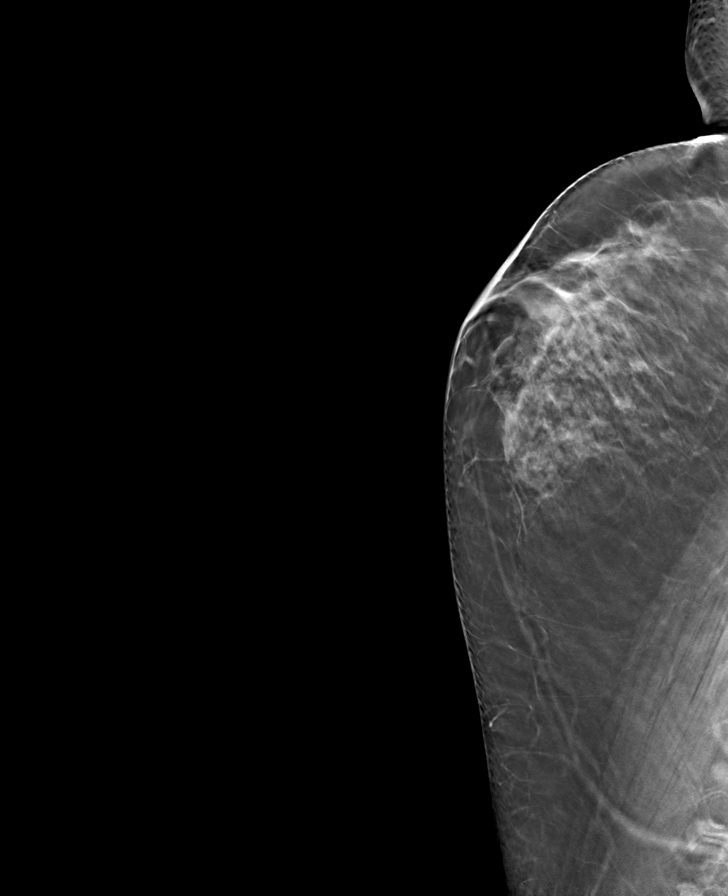

[R CC tomo · tomo slice 37/73.0]
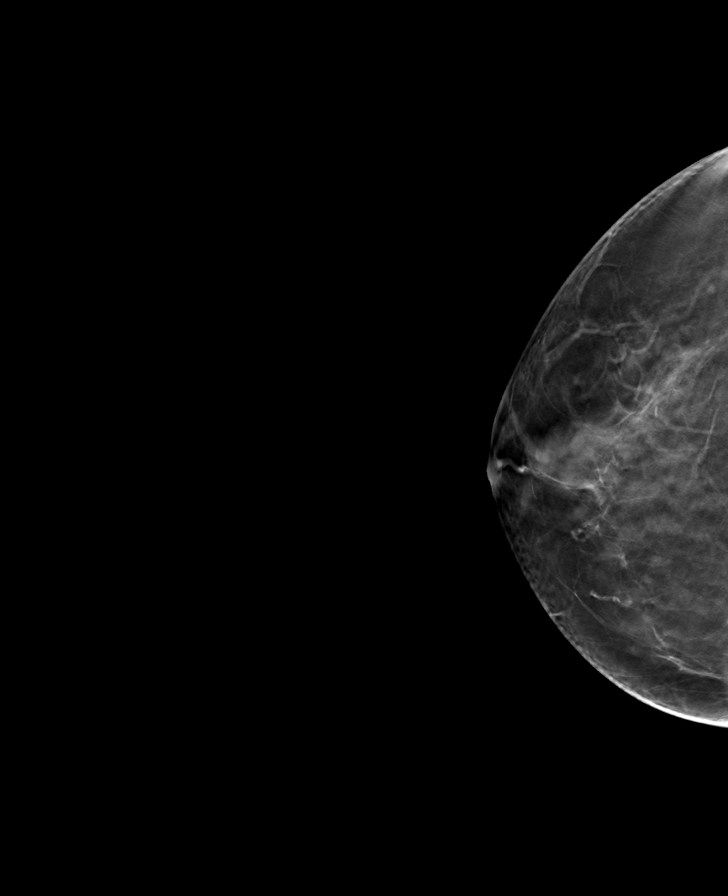

[R MLO tomo · tomo slice 36/71.0]
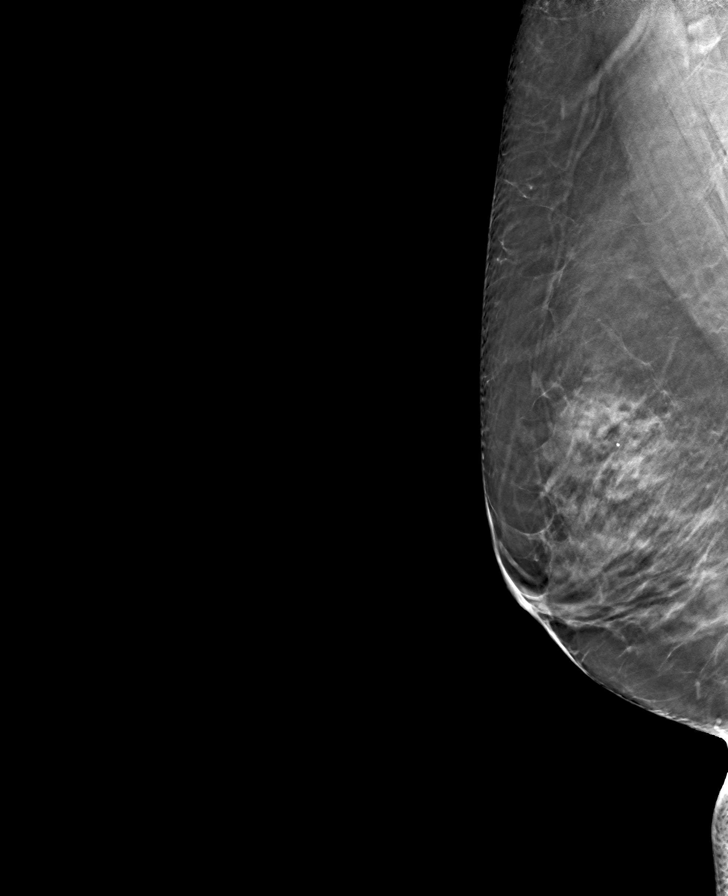

[L CC tomo · tomo slice 38/75.0]
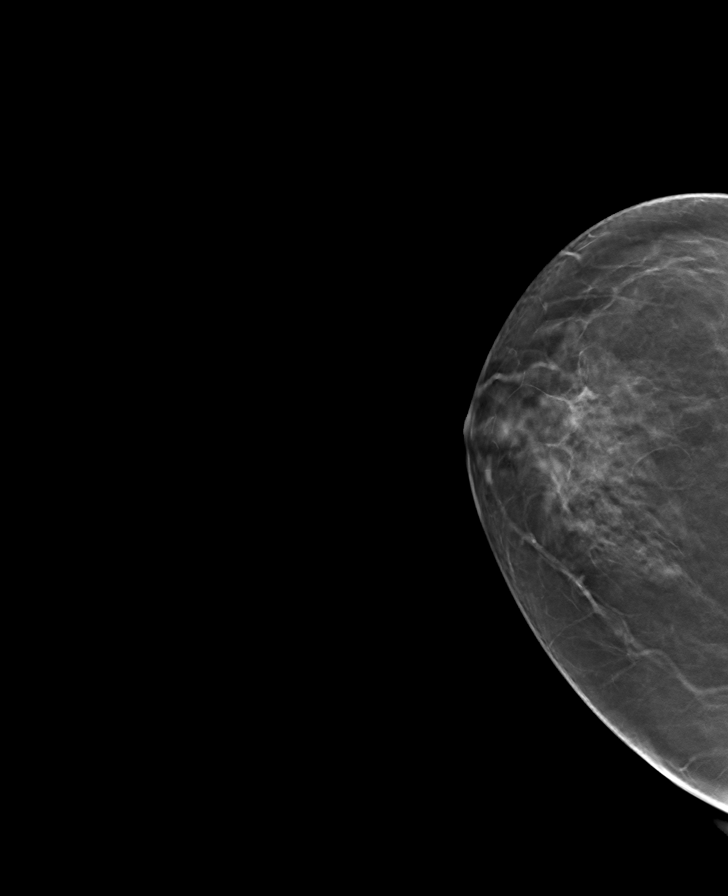

[8 of 24 positions shown; findings below may reference images not displayed]

ACR Breast Density Category c: The breast tissue is heterogeneously
dense, which may obscure small masses.
FINDINGS: There are no findings suspicious for malignancy.
IMPRESSION: No mammographic evidence of malignancy. A result letter of this
screening mammogram will be mailed directly to the patient.

RECOMMENDATION:
Screening mammogram in one year. (Code:Q3-W-BC3)

BI-RADS CATEGORY  1: Negative.

## 2023-05-07 ENCOUNTER — Ambulatory Visit: Payer: 59 | Admitting: Internal Medicine

## 2023-05-07 ENCOUNTER — Encounter: Payer: Self-pay | Admitting: Internal Medicine

## 2023-05-07 ENCOUNTER — Other Ambulatory Visit: Payer: Self-pay

## 2023-05-07 VITALS — BP 94/62 | HR 63 | Temp 98.4°F | Ht 66.5 in | Wt 130.0 lb

## 2023-05-07 DIAGNOSIS — E039 Hypothyroidism, unspecified: Secondary | ICD-10-CM

## 2023-05-07 DIAGNOSIS — Z0000 Encounter for general adult medical examination without abnormal findings: Secondary | ICD-10-CM

## 2023-05-07 DIAGNOSIS — E785 Hyperlipidemia, unspecified: Secondary | ICD-10-CM

## 2023-05-07 DIAGNOSIS — Z1231 Encounter for screening mammogram for malignant neoplasm of breast: Secondary | ICD-10-CM

## 2023-05-07 MED ORDER — FLUOXETINE HCL 10 MG PO CAPS
30.0000 mg | ORAL_CAPSULE | Freq: Every day | ORAL | 1 refills | Status: DC
  Filled 2023-05-08: qty 270, 90d supply, fill #0

## 2023-05-07 MED ORDER — PREDNISONE 10 MG PO TABS
ORAL_TABLET | ORAL | 0 refills | Status: AC
  Filled 2023-05-07: qty 21, 6d supply, fill #0

## 2023-05-07 NOTE — Progress Notes (Signed)
Patient ID: Kristina Barnes, female    DOB: [birth year]  Age: 67 y.o. MRN: [id number]  The patient is here for annual preventive examination and management of other chronic and acute problems.   The risk factors are reflected in the social history.   The roster of all physicians providing medical care to patient - is listed in the Snapshot section of the chart.   Activities of daily living:  The patient is 100% independent in all ADLs: dressing, toileting, feeding as well as independent mobility   Home safety : The patient has smoke detectors in the home. They wear seatbelts.  There are no unsecured firearms at home. There is no violence in the home.    There is no risks for hepatitis, STDs or HIV. There is no   history of blood transfusion. They have no travel history to infectious disease endemic areas of the world.   The patient has seen their dentist in the last six month. They have seen their eye doctor in the last year. The patinet  denies slight hearing difficulty with regard to whispered voices and some television programs.  They have deferred audiologic testing in the last year.  They do not  have excessive sun exposure. Discussed the need for sun protection: hats, long sleeves and use of sunscreen if there is significant sun exposure.    Diet: the importance of a healthy diet is discussed. They do have a healthy diet.   The benefits of regular aerobic exercise were discussed. The patient  exercises   5 days per week  for  60 minutes.    Depression screen: there are no signs or vegative symptoms of depression- irritability, change in appetite, anhedonia, sadness/tearfullness.   The following portions of the patient's history were reviewed and updated as appropriate: allergies, current medications, past family history, past medical history,  past surgical history, past social history  and problem list.   Visual acuity was not assessed per patient preference since the patient has  regular follow up with an  ophthalmologist. Hearing and body mass index were assessed and reviewed.    During the course of the visit the patient was educated and counseled about appropriate screening and preventive services including : fall prevention , diabetes screening, nutrition counseling, colorectal cancer screening, and recommended immunizations.    Chief Complaint:  Retiring Sept 6 from [employer]   going to [place]  for 4 weeks in mid November,  Husband retired in May .  Jan in 3 weeks in Jan    Left shoulder still problematic since injury last year.  Weaker during workouts   Recurrent episodes of diplopia when eyes are tired.  Diagnosed with Supratrochlear palsy, left eye per [facility]   Review of Symptoms  Patient denies headache, fevers, malaise, unintentional weight loss, skin rash, eye pain, sinus congestion and sinus pain, sore throat, dysphagia,  hemoptysis , cough, dyspnea, wheezing, chest pain, palpitations, orthopnea, edema, abdominal pain, nausea, melena, diarrhea, constipation, flank pain, dysuria, hematuria, urinary  Frequency, nocturia, numbness, tingling, seizures,  Focal weakness, Loss of consciousness,  Tremor, insomnia, depression, anxiety, and suicidal ideation.    Physical Exam:  BP 94/62   Pulse 63   Temp 98.4 F (36.9 C) (Oral)   Ht 5' 6.5" (1.689 m)   Wt 130 lb (59 kg)   LMP 08/19/2012   SpO2 96%   BMI 20.67 kg/m    Physical Exam Vitals reviewed.  Constitutional:      General: She is  not in acute distress.    Appearance: Normal appearance. She is well-developed and normal weight. She is not ill-appearing, toxic-appearing or diaphoretic.  HENT:     Head: Normocephalic.     Right Ear: Tympanic membrane, ear canal and external ear normal. There is no impacted cerumen.     Left Ear: Tympanic membrane, ear canal and external ear normal. There is no impacted cerumen.     Nose: Nose normal.     Mouth/Throat:     Mouth: Mucous membranes are moist.      Pharynx: Oropharynx is clear.  Eyes:     General: No scleral icterus.       Right eye: No discharge.        Left eye: No discharge.     Conjunctiva/sclera: Conjunctivae normal.     Pupils: Pupils are equal, round, and reactive to light.  Neck:     Thyroid: No thyromegaly.     Vascular: No carotid bruit or JVD.  Cardiovascular:     Rate and Rhythm: Normal rate and regular rhythm.     Heart sounds: Normal heart sounds.  Pulmonary:     Effort: Pulmonary effort is normal. No respiratory distress.     Breath sounds: Normal breath sounds.  Chest:  Breasts:    Breasts are symmetrical.     Right: Normal. No swelling, inverted nipple, mass, nipple discharge, skin change or tenderness.     Left: Normal. No swelling, inverted nipple, mass, nipple discharge, skin change or tenderness.  Abdominal:     General: Bowel sounds are normal.     Palpations: Abdomen is soft. There is no mass.     Tenderness: There is no abdominal tenderness. There is no guarding or rebound.  Musculoskeletal:        General: Normal range of motion.     Cervical back: Normal range of motion and neck supple.  Lymphadenopathy:     Cervical: No cervical adenopathy.     Upper Body:     Right upper body: No supraclavicular, axillary or pectoral adenopathy.     Left upper body: No supraclavicular, axillary or pectoral adenopathy.  Skin:    General: Skin is warm and dry.  Neurological:     General: No focal deficit present.     Mental Status: She is alert and oriented to person, place, and time. Mental status is at baseline.  Psychiatric:        Mood and Affect: Mood normal.        Behavior: Behavior normal.        Thought Content: Thought content normal.        Judgment: Judgment normal.    Assessment and Plan: Encounter for preventive health examination Assessment & Plan: age appropriate education and counseling updated, referrals for preventative services and immunizations addressed, dietary and smoking  counseling addressed, most recent labs reviewed.  I have personally reviewed and have noted:   1) the patient's medical and social history 2) The pt's use of alcohol, tobacco, and illicit drugs 3) The patient's current medications and supplements 4) Functional ability including ADL's, fall risk, home safety risk, hearing and visual impairment 5) Diet and physical activities 6) Evidence for depression or mood disorder 7) The patient's height, weight, and BMI have been recorded in the chart   I have made referrals, and provided counseling and education based on review of the above    Encounter for screening mammogram for malignant neoplasm of breast -  3D Screening Mammogram, Left and Right; Future  Acquired hypothyroidism Assessment & Plan: Thyroid function is  normal based on review of outside labs.    Hyperlipidemia, unspecified hyperlipidemia type  Other orders -     FLUoxetine HCl; Take 3 capsules (30 mg total) by mouth daily.  Dispense: 270 capsule; Refill: 1 -     predniSONE; Take 6 tablets (60 mg) by mouth daily for 1 day, 5 tablets (50 mg) daily for 1 day, 4 tablets (40 mg) daily for 1 day, 3 tablets (30 mg) daily for 1 day, 2 tablets (20 mg) daily for 1 day, THEN 1 tablet daily for 1 day.  Dispense: 21 tablet; Refill: 0    No follow-ups on file.  Sherlene Shams, MD

## 2023-05-07 NOTE — Patient Instructions (Signed)
Congratulations!  I am so HAPPY for you!  Let me know if you need any drugs for your travels  Shingrix and TDAP are free at pharmacy once you have medicare    Prednisone taper has been sent to [facility] Let me know how it works

## 2023-05-08 ENCOUNTER — Other Ambulatory Visit: Payer: Self-pay

## 2023-05-09 NOTE — Assessment & Plan Note (Addendum)
Thyroid function is  normal based on review of outside labs.

## 2023-05-09 NOTE — Assessment & Plan Note (Signed)

## 2023-06-13 ENCOUNTER — Ambulatory Visit: Admission: RE | Admit: 2023-06-13 | Payer: 59 | Source: Ambulatory Visit

## 2023-06-13 DIAGNOSIS — Z1231 Encounter for screening mammogram for malignant neoplasm of breast: Secondary | ICD-10-CM | POA: Diagnosis not present

## 2023-08-05 ENCOUNTER — Other Ambulatory Visit: Payer: Self-pay

## 2023-08-05 MED ORDER — COMIRNATY 30 MCG/0.3ML IM SUSY
PREFILLED_SYRINGE | INTRAMUSCULAR | 0 refills | Status: DC
  Filled 2023-08-05: qty 0.3, 1d supply, fill #0

## 2023-08-18 ENCOUNTER — Encounter: Payer: Self-pay | Admitting: Internal Medicine

## 2023-08-18 MED ORDER — FLUOXETINE HCL 10 MG PO CAPS: 30.0000 mg | ORAL_CAPSULE | Freq: Every day | ORAL | 1 refills | Status: DC

## 2023-08-18 MED ORDER — OSELTAMIVIR PHOSPHATE 75 MG PO CAPS: 75.0000 mg | ORAL_CAPSULE | Freq: Two times a day (BID) | ORAL | 0 refills | Status: DC

## 2023-08-18 MED ORDER — LEVOTHYROXINE SODIUM 75 MCG PO TABS: 75.0000 ug | ORAL_TABLET | Freq: Every day | ORAL | 2 refills | Status: DC

## 2023-08-18 NOTE — Telephone Encounter (Signed)
I sent all but the Tamiflu

## 2023-11-19 ENCOUNTER — Ambulatory Visit: Payer: 59 | Admitting: Dermatology

## 2024-01-26 ENCOUNTER — Ambulatory Visit: Payer: 59 | Admitting: Dermatology

## 2024-02-02 ENCOUNTER — Ambulatory Visit (INDEPENDENT_AMBULATORY_CARE_PROVIDER_SITE_OTHER): Payer: 59 | Admitting: Dermatology

## 2024-02-02 ENCOUNTER — Encounter: Payer: Self-pay | Admitting: Dermatology

## 2024-02-02 DIAGNOSIS — Z1283 Encounter for screening for malignant neoplasm of skin: Secondary | ICD-10-CM | POA: Diagnosis not present

## 2024-02-02 DIAGNOSIS — D1801 Hemangioma of skin and subcutaneous tissue: Secondary | ICD-10-CM

## 2024-02-02 DIAGNOSIS — W908XXA Exposure to other nonionizing radiation, initial encounter: Secondary | ICD-10-CM

## 2024-02-02 DIAGNOSIS — L814 Other melanin hyperpigmentation: Secondary | ICD-10-CM

## 2024-02-02 DIAGNOSIS — D229 Melanocytic nevi, unspecified: Secondary | ICD-10-CM

## 2024-02-02 DIAGNOSIS — L578 Other skin changes due to chronic exposure to nonionizing radiation: Secondary | ICD-10-CM

## 2024-02-02 DIAGNOSIS — L570 Actinic keratosis: Secondary | ICD-10-CM | POA: Diagnosis not present

## 2024-02-02 DIAGNOSIS — L6612 Frontal fibrosing alopecia: Secondary | ICD-10-CM | POA: Insufficient documentation

## 2024-02-02 DIAGNOSIS — Z85828 Personal history of other malignant neoplasm of skin: Secondary | ICD-10-CM

## 2024-02-02 DIAGNOSIS — L821 Other seborrheic keratosis: Secondary | ICD-10-CM

## 2024-02-02 MED ORDER — FLUOROURACIL 5 % EX CREA
TOPICAL_CREAM | Freq: Two times a day (BID) | CUTANEOUS | 2 refills | Status: DC
Start: 2024-02-02 — End: 2024-05-07

## 2024-02-02 MED ORDER — OPZELURA 1.5 % EX CREA: TOPICAL_CREAM | CUTANEOUS | 2 refills | Status: DC

## 2024-02-02 MED ORDER — CLOBETASOL PROPIONATE 0.05 % EX SOLN
CUTANEOUS | 2 refills | Status: DC
Start: 2024-02-02 — End: 2024-05-07

## 2024-02-02 NOTE — Progress Notes (Signed)
 Follow-Up Visit   Subjective  Kristina Barnes is a 68 y.o. female who presents for the following: Skin Cancer Screening and Full Body Skin Exam. Hx of BCC.  Check scaly lesions on B/L cheeks. Raised rough lesions at B/L temples in hair line.   The patient presents for Total-Body Skin Exam (TBSE) for skin cancer screening and mole check. The patient has spots, moles and lesions to be evaluated, some may be new or changing and the patient may have concern these could be cancer.    The following portions of the chart were reviewed this encounter and updated as appropriate: medications, allergies, medical history  Review of Systems:  No other skin or systemic complaints except as noted in HPI or Assessment and Plan.  Objective  Well appearing patient in no apparent distress; mood and affect are within normal limits.  A full examination was performed including scalp, head, eyes, ears, nose, lips, neck, chest, axillae, abdomen, back, buttocks, bilateral upper extremities, bilateral lower extremities, hands, feet, fingers, toes, fingernails, and toenails. All findings within normal limits unless otherwise noted below.   Relevant physical exam findings are noted in the Assessment and Plan.             frontal scalp/hair line Symmetric band-like zone of hair loss along the anterior hairline. The eyebrows are thinned. Perifollicular erythematous scaly macules on frontal hair line  Assessment & Plan   SKIN CANCER SCREENING PERFORMED TODAY.  HISTORY OF BASAL CELL CARCINOMA OF THE SKIN. Left infraorbital. 2015. - No evidence of recurrence today - Recommend regular full body skin exams - Recommend daily broad spectrum sunscreen SPF 30+ to sun-exposed areas, reapply every 2 hours as needed.  - Call if any new or changing lesions are noted between office visits   ACTINIC DAMAGE WITH PRECANCEROUS ACTINIC KERATOSES Counseling for Topical Chemotherapy Management: Patient  exhibits: - Severe, confluent actinic changes with pre-cancerous actinic keratoses that is secondary to cumulative UV radiation exposure over time - Condition that is severe; chronic, not at goal. - diffuse scaly erythematous macules and papules with underlying dyspigmentation - Discussed Prescription "Field Treatment" topical Chemotherapy for Severe, Chronic Confluent Actinic Changes with Pre-Cancerous Actinic Keratoses Field treatment involves treatment of an entire area of skin that has confluent Actinic Changes (Sun/ Ultraviolet light damage) and PreCancerous Actinic Keratoses by method of PhotoDynamic Therapy (PDT) and/or prescription Topical Chemotherapy agents such as 5-fluorouracil , 5-fluorouracil /calcipotriene, and/or imiquimod.  The purpose is to decrease the number of clinically evident and subclinical PreCancerous lesions to prevent progression to development of skin cancer by chemically destroying early precancer changes that may or may not be visible.  It has been shown to reduce the risk of developing skin cancer in the treated area. As a result of treatment, redness, scaling, crusting, and open sores may occur during treatment course. One or more than one of these methods may be used and may have to be used several times to control, suppress and eliminate the PreCancerous changes. Discussed treatment course, expected reaction, and possible side effects. - Recommend daily broad spectrum sunscreen SPF 30+ to sun-exposed areas, reapply every 2 hours as needed.  - Staying in the shade or wearing long sleeves, sun glasses (UVA+UVB protection) and wide brim hats (4-inch brim around the entire circumference of the hat) are also recommended. - Call for new or changing lesions.  - Start 5-fluorouracil /calcipotriene cream twice a day for 7 days or until reaction develops then stop, to affected areas including cheeks. Prescription sent  to [facility]. Patient advised they will  receive an email to purchase the medication online and have it sent to their home. Patient provided with handout reviewing treatment course and side effects and advised to call or message us  on MyChart with any concerns.  Reviewed course of treatment and expected reaction.  Patient advised to expect inflammation and crusting and advised that erosions are possible.  Patient advised to be diligent with sun protection during and after treatment. Counseled to keep medication out of reach of children and pets.   LENTIGINES, SEBORRHEIC KERATOSES, HEMANGIOMAS - Benign normal skin lesions - Benign-appearing - Call for any changes  MELANOCYTIC NEVI - Tan-brown and/or pink-flesh-colored symmetric macules and papules - Benign appearing on exam today - Observation - Call clinic for new or changing moles - Recommend daily use of broad spectrum spf 30+ sunscreen to sun-exposed areas.    FRONTAL FIBROSING ALOPECIA frontal scalp/hair line Frontal fibrosing alopecia (FFA) is a chronic/progressive and irreversible patterned form of scarring alopecia that presents as a symmetric band-like zone of hair loss along the anterior hairline. The eyebrows are often thinned or absent. It is considered a variant of lichen planopilaris in a patterned distribution and is more common in women. Therapies may stop or slow progression but generally do not lead to hair regrowth.   Start Clobetasol  solution twice daily to affected areas at frontal hair line until redness has improved. Then switch to opzelura . Avoid applying to face, groin, and axilla. Use as directed. Long-term use can cause thinning of the skin.  Once improved start Opzelura  twice daily to affected areas.  Samples x2 given. Lot: [id number]. Exp: 12/11/2024  Topical steroids (such as triamcinolone, fluocinolone, fluocinonide, mometasone, clobetasol , halobetasol, betamethasone, hydrocortisone) can cause thinning and lightening of the skin if they are used for too  long in the same area. Your physician has selected the right strength medicine for your problem and area affected on the body. Please use your medication only as directed by your physician to prevent side effects.   Discussed systemics such as spironolactone and 5-a-reductase inhibitors Related Medications clobetasol  (TEMOVATE ) 0.05 % external solution Apply twice daily to affected areas on scalp/hair line until improved Ruxolitinib Phosphate  (OPZELURA ) 1.5 % CREA Apply twice daily to affected areas on scalp/hair line MULTIPLE BENIGN NEVI   LENTIGINES   ACTINIC ELASTOSIS   SEBORRHEIC KERATOSES   CHERRY ANGIOMA   ACTINIC KERATOSES   Related Medications fluorouracil  (EFUDEX ) 5 % cream Apply topically 2 (two) times daily. Apply twice daily to affected areas on cheeks for 7 days Return in about 1 year (around 02/01/2025) for TBSE, HxBCC; FFA follow up in 2-3 months.  I, Jill Parcell, CMA, am acting as scribe for Harris Liming, MD.   Documentation: I have reviewed the above documentation for accuracy and completeness, and I agree with the above.  Harris Liming, MD

## 2024-02-02 NOTE — Patient Instructions (Addendum)
 - Start 5-fluorouracil /calcipotriene cream twice a day for 7 days to affected areas including cheeks. Prescription sent to [facility]. Patient advised they will receive an email to purchase the medication online and have it sent to their home. Patient provided with handout reviewing treatment course and side effects and advised to call or message us  on MyChart with any concerns.  Reviewed course of treatment and expected reaction.  Patient advised to expect inflammation and crusting and advised that erosions are possible.  Patient advised to be diligent with sun protection during and after treatment. Counseled to keep medication out of reach of children and pets.    Instructions for [facility]  One or more of your medications was sent to the [facility]. You will receive an email from them and can purchase the medicine through that link. It will then be mailed to your home at the address you confirmed. If for any reason you do not receive an email from them, please check your spam folder. If you still do not find the email, please let us  know. [facility] phone number is [phone].    Frontal fibrosing alopecia (FFA) is a chronic/progressive and irreversible patterned form of scarring alopecia that presents as a symmetric band-like zone of hair loss along the anterior hairline. The eyebrows are often thinned or absent. It is considered a variant of lichen planopilaris in a patterned distribution and is more common in women. Therapies may stop or slow progression but generally do not lead to hair regrowth.    Start Clobetasol  solution twice daily to affected areas at frontal hair line until redness has improved. Avoid applying to face, groin, and axilla. Use as directed. Long-term use can cause thinning of the skin.  Once improved start Opzelura  twice daily to affected areas.   Topical steroids (such as  triamcinolone, fluocinolone, fluocinonide, mometasone, clobetasol , halobetasol, betamethasone, hydrocortisone) can cause thinning and lightening of the skin if they are used for too long in the same area. Your physician has selected the right strength medicine for your problem and area affected on the body. Please use your medication only as directed by your physician to prevent side effects.     Recommend daily broad spectrum sunscreen SPF 30+ to sun-exposed areas, reapply every 2 hours as needed. Call for new or changing lesions.  Staying in the shade or wearing long sleeves, sun glasses (UVA+UVB protection) and wide brim hats (4-inch brim around the entire circumference of the hat) are also recommended for sun protection.      Melanoma ABCDEs  Melanoma is the most dangerous type of skin cancer, and is the leading cause of death from skin disease.  You are more likely to develop melanoma if you: Have light-colored skin, light-colored eyes, or red or blond hair Spend a lot of time in the sun Tan regularly, either outdoors or in a tanning bed Have had blistering sunburns, especially during childhood Have a close family member who has had a melanoma Have atypical moles or large birthmarks  Early detection of melanoma is key since treatment is typically straightforward and cure rates are extremely high if we catch it early.   The first sign of melanoma is often a change in a mole or a new dark spot.  The ABCDE system is a way of remembering the signs of melanoma.  A for asymmetry:  The two halves do not match. B for border:  The edges of the growth are  irregular. C for color:  A mixture of colors are present instead of an even brown color. D for diameter:  Melanomas are usually (but not always) greater than 6mm - the size of a pencil eraser. E for evolution:  The spot keeps changing in size, shape, and color.  Please check your skin once per month between visits. You can use a small  mirror in front and a large mirror behind you to keep an eye on the back side or your body.   If you see any new or changing lesions before your next follow-up, please call to schedule a visit.  Please continue daily skin protection including broad spectrum sunscreen SPF 30+ to sun-exposed areas, reapplying every 2 hours as needed when you're outdoors.   Staying in the shade or wearing long sleeves, sun glasses (UVA+UVB protection) and wide brim hats (4-inch brim around the entire circumference of the hat) are also recommended for sun protection.      Due to recent changes in healthcare laws, you may see results of your pathology and/or laboratory studies on MyChart before the doctors have had a chance to review them. We understand that in some cases there may be results that are confusing or concerning to you. Please understand that not all results are received at the same time and often the doctors may need to interpret multiple results in order to provide you with the best plan of care or course of treatment. Therefore, we ask that you please give us  2 business days to thoroughly review all your results before contacting the office for clarification. Should we see a critical lab result, you will be contacted sooner.   If You Need Anything After Your Visit  If you have any questions or concerns for your doctor, please call our main line at [phone] and press option 4 to reach your doctor's medical assistant. If no one answers, please leave a voicemail as directed and we will return your call as soon as possible. Messages left after 4 pm will be answered the following business day.   You may also send us  a message via MyChart. We typically respond to MyChart messages within 1-2 business days.  For prescription refills, please ask your pharmacy to contact our office. Our fax number is [phone].  If you have an urgent issue when the clinic is closed that cannot wait until the next  business day, you can page your doctor at the number below.    Please note that while we do our best to be available for urgent issues outside of office hours, we are not available 24/7.   If you have an urgent issue and are unable to reach us , you may choose to seek medical care at your doctor's office, retail clinic, urgent care center, or emergency room.  If you have a medical emergency, please immediately call 911 or go to the emergency department.  Pager Numbers  - Dr. Bary Likes: [phone]  - Dr. Annette Barters: [phone]  - Dr. Felipe Horton: [phone]   In the event of inclement weather, please call our main line at [phone] for an update on the status of any delays or closures.  Dermatology Medication Tips: Please keep the boxes that topical medications come in in order to help keep track of the instructions about where and how to use these. Pharmacies typically print the medication instructions only on the boxes and not directly on the medication tubes.   If your medication is too expensive, please contact our  office at [phone] option 4 or send us  a message through MyChart.   We are unable to tell what your co-pay for medications will be in advance as this is different depending on your insurance coverage. However, we may be able to find a substitute medication at lower cost or fill out paperwork to get insurance to cover a needed medication.   If a prior authorization is required to get your medication covered by your insurance company, please allow us  1-2 business days to complete this process.  Drug prices often vary depending on where the prescription is filled and some pharmacies may offer cheaper prices.  The website www.goodrx.com contains coupons for medications through different pharmacies. The prices here do not account for what the cost may be with help from insurance (it may be cheaper with your insurance), but the website can give you the price if you did not use  any insurance.  - You can print the associated coupon and take it with your prescription to the pharmacy.  - You may also stop by our office during regular business hours and pick up a GoodRx coupon card.  - If you need your prescription sent electronically to a different pharmacy, notify our office through [facility] or by phone at [phone] option 4.     Si Usted Necesita Algo Despus de Su Visita  Tambin puede enviarnos un mensaje a travs de Clinical cytogeneticist. Por lo general respondemos a los mensajes de MyChart en el transcurso de 1 a 2 das hbiles.  Para renovar recetas, por favor pida a su farmacia que se ponga en contacto con nuestra oficina. Franz Jacks de fax es [facility] [phone].  Si tiene un asunto urgente cuando la clnica est cerrada y que no puede esperar hasta el siguiente da hbil, puede llamar/localizar a su doctor(a) al nmero que aparece a continuacin.   Por favor, tenga en cuenta que aunque hacemos todo lo posible para estar disponibles para asuntos urgentes fuera del horario de [place], no estamos disponibles las 24 horas del da, los 7 [address]  [address] de la [place].   Si tiene un problema urgente y no puede comunicarse con nosotros, puede optar por buscar atencin mdica  en el consultorio de su doctor(a), en una clnica privada, en un centro de atencin urgente o en una sala de emergencias.  Si tiene Engineer, drilling, por favor llame inmediatamente al 911 o vaya a la sala de emergencias.  Nmeros de bper  - Dr. Bary Likes: [phone]  - Dra. Annette Barters: [phone]  - Dr. Felipe Horton: [phone]   En caso de inclemencias del tiempo, por favor llame a Lajuan Pila principal al [phone] para una actualizacin sobre el [place] de cualquier retraso o cierre.  Consejos para la medicacin en dermatologa: Por favor, guarde las cajas en las que vienen los medicamentos de uso tpico para ayudarle a seguir las instrucciones sobre dnde y cmo usarlos. Las farmacias  generalmente imprimen las instrucciones del medicamento slo en las cajas y no directamente en los tubos del [place].   Si su medicamento es muy caro, por favor, pngase en contacto con Bettyjane Brunet llamando al [phone] y presione la opcin 4 o envenos un mensaje a travs de Clinical cytogeneticist.   No podemos decirle cul ser su copago por los medicamentos por adelantado ya que esto es diferente dependiendo de la cobertura de su seguro. Sin embargo, es posible que podamos encontrar un medicamento sustituto a Audiological scientist un formulario para que el seguro cubra el medicamento  que se considera necesario.   Si se requiere una autorizacin previa para que su compaa de seguros [place] su medicamento, por favor permtanos de 1 a 2 das hbiles para completar este proceso.  Los precios de los medicamentos varan con frecuencia dependiendo del Environmental consultant de dnde se surte la receta y alguna farmacias pueden ofrecer precios ms baratos.  El sitio web www.goodrx.com tiene cupones para medicamentos de Health and safety inspector. Los precios aqu no tienen en cuenta lo que podra costar con la ayuda del seguro (puede ser ms barato con su seguro), pero el sitio web puede darle el precio si no utiliz Tourist information centre manager.  - Puede imprimir el cupn correspondiente y llevarlo con su receta a la farmacia.  - Tambin puede pasar por nuestra oficina durante el horario de atencin regular y Education officer, museum una tarjeta de cupones de GoodRx.  - Si necesita que su receta se enve electrnicamente a una farmacia diferente, informe a nuestra oficina a travs de MyChart de [place] o por telfono llamando al [phone] y presione la opcin 4.

## 2024-02-09 ENCOUNTER — Telehealth: Payer: Self-pay

## 2024-02-09 NOTE — Telephone Encounter (Signed)
 Opzelura  cream denied by insurance since it is not an approved medication for frontal fibrosing alopecia. Please advise.

## 2024-02-12 NOTE — Telephone Encounter (Signed)
 Kristina Barnes states that they are unable to use coupons for Opzelura  due to patient's insurance and would like an alternative medication sent in. Please advise.

## 2024-02-17 MED ORDER — PIMECROLIMUS 1 % EX CREA: TOPICAL_CREAM | Freq: Two times a day (BID) | CUTANEOUS | 1 refills | Status: DC

## 2024-02-17 NOTE — Telephone Encounter (Signed)
 Good morning Dr. Fonda Hymen,   Since Opzelura  cream is not covered by your insurance Dr. Felipe Horton asked me to send in Elidel cream for you. You should use it twice daily for maintenance after Clobetasol . Please let us  know if you need anything. Thank you.

## 2024-02-17 NOTE — Addendum Note (Signed)
 Addended by: Mara Semin[place] A on: 02/17/2024 08:23 AM   Modules accepted: Orders

## 2024-02-18 ENCOUNTER — Other Ambulatory Visit: Payer: Self-pay | Admitting: Dermatology

## 2024-02-18 DIAGNOSIS — L6612 Frontal fibrosing alopecia: Secondary | ICD-10-CM

## 2024-02-18 MED ORDER — TACROLIMUS 0.1 % EX OINT
TOPICAL_OINTMENT | Freq: Two times a day (BID) | CUTANEOUS | 0 refills | Status: DC
Start: 2024-02-18 — End: 2024-04-08

## 2024-04-05 ENCOUNTER — Other Ambulatory Visit: Payer: Self-pay | Admitting: Internal Medicine

## 2024-04-05 MED ORDER — CIPROFLOXACIN HCL 250 MG PO TABS: 250.0000 mg | ORAL_TABLET | Freq: Two times a day (BID) | ORAL | 0 refills | Status: AC

## 2024-04-08 ENCOUNTER — Encounter: Payer: Self-pay | Admitting: Dermatology

## 2024-04-08 ENCOUNTER — Ambulatory Visit (INDEPENDENT_AMBULATORY_CARE_PROVIDER_SITE_OTHER): Admitting: Dermatology

## 2024-04-08 DIAGNOSIS — L649 Androgenic alopecia, unspecified: Secondary | ICD-10-CM | POA: Diagnosis not present

## 2024-04-08 DIAGNOSIS — L6612 Frontal fibrosing alopecia: Secondary | ICD-10-CM

## 2024-04-08 DIAGNOSIS — L578 Other skin changes due to chronic exposure to nonionizing radiation: Secondary | ICD-10-CM | POA: Diagnosis not present

## 2024-04-08 DIAGNOSIS — W908XXD Exposure to other nonionizing radiation, subsequent encounter: Secondary | ICD-10-CM | POA: Diagnosis not present

## 2024-04-08 DIAGNOSIS — L658 Other specified nonscarring hair loss: Secondary | ICD-10-CM | POA: Insufficient documentation

## 2024-04-08 MED ORDER — TACROLIMUS 0.1 % EX OINT: TOPICAL_OINTMENT | Freq: Two times a day (BID) | CUTANEOUS | 11 refills | Status: AC

## 2024-04-08 MED ORDER — SPIRONOLACTONE 50 MG PO TABS: 100.0000 mg | ORAL_TABLET | Freq: Every day | ORAL | 11 refills | Status: DC

## 2024-04-08 NOTE — Progress Notes (Signed)
 Follow-Up Visit   Subjective  Kristina Barnes is a 68 y.o. female who presents for the following: 2 month follow up. Frontal fibrosing alopecia. Used Clobetasol  solution as directed, states it calmed down the inflammation. Used Opzelura  sample as directed until received Pimecrolimus . Now using Pimecrolimus  as directed. States condition is stable. Has not noticed any worsening. Has not noticed any more hair loss.   Recheck cheeks and nose. Used 5FU/Calcipotriene as directed since last visit. States areas on face are smoother now.   The patient has spots, moles and lesions to be evaluated, some may be new or changing and the patient may have concern these could be cancer.   The following portions of the chart were reviewed this encounter and updated as appropriate: medications, allergies, medical history  Review of Systems:  No other skin or systemic complaints except as noted in HPI or Assessment and Plan.  Objective  Well appearing patient in no apparent distress; mood and affect are within normal limits.  A focused examination was performed of the following areas: Scalp, face  Relevant exam findings are noted in the Assessment and Plan.  frontal scalp/hair line Symmetric band-like zone of hair loss along the anterior hairline. The eyebrows are thinned. reduced perifollicular erythematous scaly macules on frontal hair line          Assessment & Plan   ACTINIC DAMAGE, improved - chronic, secondary to cumulative UV radiation exposure/sun exposure over time - diffuse scaly erythematous macules with underlying dyspigmentation - Recommend daily broad spectrum sunscreen SPF 30+ to sun-exposed areas, reapply every 2 hours as needed.  - Recommend staying in the shade or wearing long sleeves, sun glasses (UVA+UVB protection) and wide brim hats (4-inch brim around the entire circumference of the hat). - Call for new or changing lesions.   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 Condition is symptomatic / bothersome to patient. Not to goal.  Denies issues with potassium levels or low blood pressure.   Female Androgenic Alopecia is a chronic condition related to genetics and/or hormonal changes.  In women androgenetic alopecia is commonly associated with menopause but may occur any time after puberty.  It causes hair thinning primarily on the crown with widening of the part and temporal hairline recession.  Can use OTC Rogaine (minoxidil) 5% solution/foam as directed.  Oral treatments in female patients who have no contraindication may include : - Low dose oral minoxidil 1.25 - 5mg  daily - Spironolactone  50 - 100mg  bid - Finasteride 2.5 - 5 mg daily Adjunctive therapies include: - Low Level Laser Light Therapy (LLLT) - Platelet-rich plasma injections (PRP) - Hair Transplants or scalp reduction   Treatment Plan: Start Spironolactone  50 mg once daily. After 2 weeks if tolerating increase to 100 mg daily.   Spironolactone  can cause increased urination and cause blood pressure to decrease. Please watch for signs of lightheadedness and be cautious when changing position. It can sometimes cause breast tenderness or an irregular period in premenopausal women. It can also increase potassium. The increase in potassium usually is not a concern unless you are taking other medicines that also increase potassium, so please be sure your doctor knows all of the other medications you are taking. This medication should not be taken by pregnant women.  This medicine should also not be taken together with sulfa drugs like Bactrim (  trimethoprim/sulfamethexazole).     Long term medication management.  Patient is using long term (months to years) prescription medication  to control their dermatologic condition.  These  medications require periodic monitoring to evaluate for efficacy and side effects and may require periodic laboratory monitoring.    FRONTAL FIBROSING ALOPECIA frontal scalp/hair line Frontal fibrosing alopecia (FFA) is a chronic/progressive and irreversible patterned form of scarring alopecia that presents as a symmetric band-like zone of hair loss along the anterior hairline. The eyebrows are often thinned or absent. It is considered a variant of lichen planopilaris in a patterned distribution and is more common in women. Therapies may stop or slow progression but generally do not lead to hair regrowth.   Use Clobetasol  solution twice daily to affected areas at frontal hair line as needed for flares of inflammation. Avoid applying to face, groin, and axilla. Use as directed. Long-term use can cause thinning of the skin. Switch to tacrolimus  daily or BID when stable   Topical steroids (such as triamcinolone, fluocinolone, fluocinonide, mometasone, clobetasol , halobetasol, betamethasone, hydrocortisone) can cause thinning and lightening of the skin if they are used for too long in the same area. Your physician has selected the right strength medicine for your problem and area affected on the body. Please use your medication only as directed by your physician to prevent side effects.   Discussed systemics such as spironolactone  and 5-a-reductase inhibitors Related Medications clobetasol  (TEMOVATE ) 0.05 % external solution Apply twice daily to affected areas on scalp/hair line until improved tacrolimus  (PROTOPIC ) 0.1 % ointment Apply topically 2 (two) times daily. Apply to affected areas on scalp after clobetasol  FEMALE PATTERN HAIR LOSS   ACTINIC ELASTOSIS    Return for TBSE As Scheduled.  I, Jill Parcell, CMA, am acting as scribe for Boneta Sharps, MD.   Documentation: I have reviewed the above documentation for accuracy and completeness, and I agree with the above.  Boneta Sharps, MD

## 2024-04-08 NOTE — Patient Instructions (Addendum)
 Start Spironolactone 50 mg once daily. After 2 weeks if tolerating increase to 100 mg daily.   Spironolactone can cause increased urination and cause blood pressure to decrease. Please watch for signs of lightheadedness and be cautious when changing position. It can sometimes cause breast tenderness or an irregular period in premenopausal women. It can also increase potassium. The increase in potassium usually is not a concern unless you are taking other medicines that also increase potassium, so please be sure your doctor knows all of the other medications you are taking. This medication should not be taken by pregnant women.  This medicine should also not be taken together with sulfa drugs like Bactrim (trimethoprim/sulfamethexazole).    Use Clobetasol  solution twice daily to affected areas at frontal hair line as needed for flares of inflammation. Avoid applying to face, groin, and axilla. Use as directed. Long-term use can cause thinning of the skin.  Continue Pimecrolimus  until filling Tacrolimus .   Topical steroids (such as triamcinolone, fluocinolone, fluocinonide, mometasone, clobetasol , halobetasol, betamethasone, hydrocortisone) can cause thinning and lightening of the skin if they are used for too long in the same area. Your physician has selected the right strength medicine for your problem and area affected on the body. Please use your medication only as directed by your physician to prevent side effects.   Due to recent changes in healthcare laws, you may see results of your pathology and/or laboratory studies on MyChart before the doctors have had a chance to review them. We understand that in some cases there may be results that are confusing or concerning to you. Please understand that not all results are received at the same time and often the doctors may need to interpret multiple results in order to provide you with the best plan of care or course of treatment. Therefore, we ask that  you please give us  2 business days to thoroughly review all your results before contacting the office for clarification. Should we see a critical lab result, you will be contacted sooner.   If You Need Anything After Your Visit  If you have any questions or concerns for your doctor, please call our main line at [phone] and press option 4 to reach your doctor's medical assistant. If no one answers, please leave a voicemail as directed and we will return your call as soon as possible. Messages left after 4 pm will be answered the following business day.   You may also send us  a message via MyChart. We typically respond to MyChart messages within 1-2 business days.  For prescription refills, please ask your pharmacy to contact our office. Our fax number is [phone].  If you have an urgent issue when the clinic is closed that cannot wait until the next business day, you can page your doctor at the number below.    Please note that while we do our best to be available for urgent issues outside of office hours, we are not available 24/7.   If you have an urgent issue and are unable to reach us , you may choose to seek medical care at your doctor's office, retail clinic, urgent care center, or emergency room.  If you have a medical emergency, please immediately call 911 or go to the emergency department.  Pager Numbers  - Dr. Hester: [phone]  - Dr. Jackquline: [phone]  - Dr. Claudene: [phone]   In the event of inclement weather, please call our main line at [phone] for an update on the status of any  delays or closures.  Dermatology Medication Tips: Please keep the boxes that topical medications come in in order to help keep track of the instructions about where and how to use these. Pharmacies typically print the medication instructions only on the boxes and not directly on the medication tubes.   If your medication is too expensive, please contact our office at  [phone] option 4 or send us  a message through MyChart.   We are unable to tell what your co-pay for medications will be in advance as this is different depending on your insurance coverage. However, we may be able to find a substitute medication at lower cost or fill out paperwork to get insurance to cover a needed medication.   If a prior authorization is required to get your medication covered by your insurance company, please allow us  1-2 business days to complete this process.  Drug prices often vary depending on where the prescription is filled and some pharmacies may offer cheaper prices.  The website www.goodrx.com contains coupons for medications through different pharmacies. The prices here do not account for what the cost may be with help from insurance (it may be cheaper with your insurance), but the website can give you the price if you did not use any insurance.  - You can print the associated coupon and take it with your prescription to the pharmacy.  - You may also stop by our office during regular business hours and pick up a GoodRx coupon card.  - If you need your prescription sent electronically to a different pharmacy, notify our office through [facility] or by phone at [phone] option 4.     Si Usted Necesita Algo Despus de Su Visita  Tambin puede enviarnos un mensaje a travs de Clinical cytogeneticist. Por lo general respondemos a los mensajes de MyChart en el transcurso de 1 a 2 das hbiles.  Para renovar recetas, por favor pida a su farmacia que se ponga en contacto con nuestra oficina. Randi lakes de fax es [place] [phone].  Si tiene un asunto urgente cuando la clnica est cerrada y que no puede esperar hasta el siguiente da hbil, puede llamar/localizar a su doctor(a) al nmero que aparece a continuacin.   Por favor, tenga en cuenta que aunque hacemos todo lo posible para estar disponibles para asuntos urgentes fuera del horario de [place], no estamos  disponibles las 24 horas del da, los 7 [address]  [address] de la [place].   Si tiene un problema urgente y no puede comunicarse con nosotros, puede optar por buscar atencin mdica  en el consultorio de su doctor(a), en una clnica privada, en un centro de atencin urgente o en una sala de emergencias.  Si tiene Engineer, drilling, por favor llame inmediatamente al 911 o vaya a la sala de emergencias.  Nmeros de bper  - Dr. Hester: [phone]  - Dra. Jackquline: [phone]  - Dr. Claudene: [phone]   En caso de inclemencias del tiempo, por favor llame a landry capes principal al [phone] para una actualizacin sobre el [place] de cualquier retraso o cierre.  Consejos para la medicacin en dermatologa: Por favor, guarde las cajas en las que vienen los medicamentos de uso tpico para ayudarle a seguir las instrucciones sobre dnde y cmo usarlos. Las farmacias generalmente imprimen las instrucciones del medicamento slo en las cajas y no directamente en los tubos del [place].   Si su medicamento es muy caro, por favor, pngase en contacto con landry rieger llamando al [phone] y presione la  opcin 4 o envenos un mensaje a travs de MyChart.   No podemos decirle cul ser su copago por los medicamentos por adelantado ya que esto es diferente dependiendo de la cobertura de su seguro. Sin embargo, es posible que podamos encontrar un medicamento sustituto a Audiological scientist un formulario para que el seguro cubra el medicamento que se considera necesario.   Si se requiere una autorizacin previa para que su compaa de seguros malta su medicamento, por favor permtanos de 1 a 2 das hbiles para completar este proceso.  Los precios de los medicamentos varan con frecuencia dependiendo del Environmental consultant de dnde se surte la receta y alguna farmacias pueden ofrecer precios ms baratos.  El sitio web www.goodrx.com tiene cupones para medicamentos de Health and safety inspector. Los precios aqu no  tienen en cuenta lo que podra costar con la ayuda del seguro (puede ser ms barato con su seguro), pero el sitio web puede darle el precio si no utiliz Tourist information centre manager.  - Puede imprimir el cupn correspondiente y llevarlo con su receta a la farmacia.  - Tambin puede pasar por nuestra oficina durante el horario de atencin regular y Education officer, museum una tarjeta de cupones de GoodRx.  - Si necesita que su receta se enve electrnicamente a una farmacia diferente, informe a nuestra oficina a travs de MyChart de [place] o por telfono llamando al [phone] y presione la opcin 4.

## 2024-04-12 ENCOUNTER — Encounter: Payer: Self-pay | Admitting: Dermatology

## 2024-05-07 ENCOUNTER — Encounter: Payer: Self-pay | Admitting: Internal Medicine

## 2024-05-07 ENCOUNTER — Ambulatory Visit (INDEPENDENT_AMBULATORY_CARE_PROVIDER_SITE_OTHER): Admitting: Internal Medicine

## 2024-05-07 VITALS — BP 94/60 | HR 75 | Temp 97.8°F | Resp 20 | Ht 64.5 in | Wt 135.1 lb

## 2024-05-07 DIAGNOSIS — R5383 Other fatigue: Secondary | ICD-10-CM

## 2024-05-07 DIAGNOSIS — D649 Anemia, unspecified: Secondary | ICD-10-CM

## 2024-05-07 DIAGNOSIS — R7301 Impaired fasting glucose: Secondary | ICD-10-CM | POA: Diagnosis not present

## 2024-05-07 DIAGNOSIS — L6612 Frontal fibrosing alopecia: Secondary | ICD-10-CM

## 2024-05-07 DIAGNOSIS — E039 Hypothyroidism, unspecified: Secondary | ICD-10-CM

## 2024-05-07 DIAGNOSIS — Z1231 Encounter for screening mammogram for malignant neoplasm of breast: Secondary | ICD-10-CM

## 2024-05-07 DIAGNOSIS — E785 Hyperlipidemia, unspecified: Secondary | ICD-10-CM | POA: Diagnosis not present

## 2024-05-07 DIAGNOSIS — I679 Cerebrovascular disease, unspecified: Secondary | ICD-10-CM

## 2024-05-07 DIAGNOSIS — L570 Actinic keratosis: Secondary | ICD-10-CM

## 2024-05-07 DIAGNOSIS — G629 Polyneuropathy, unspecified: Secondary | ICD-10-CM

## 2024-05-07 MED ORDER — LEVOTHYROXINE SODIUM 75 MCG PO TABS: 75.0000 ug | ORAL_TABLET | Freq: Every day | ORAL | 2 refills | Status: AC

## 2024-05-07 MED ORDER — ESTRADIOL 0.1 MG/GM VA CREA
1.0000 | TOPICAL_CREAM | Freq: Every day | VAGINAL | 12 refills | Status: AC
Start: 2024-05-07 — End: ?

## 2024-05-07 MED ORDER — ALENDRONATE SODIUM 70 MG PO TABS: 70.0000 mg | ORAL_TABLET | ORAL | 3 refills | Status: AC

## 2024-05-07 MED ORDER — SPIRONOLACTONE 100 MG PO TABS
100.0000 mg | ORAL_TABLET | Freq: Every day | ORAL | 3 refills | Status: AC
Start: 2024-05-07 — End: ?

## 2024-05-07 NOTE — Patient Instructions (Signed)
 Referral to [place] neurology once you review the bio's on   Rebecca Tat  Kristina Barnes

## 2024-05-07 NOTE — Progress Notes (Signed)
 Patient ID: Kristina Barnes, female    DOB: [birth year]  Age: 68 y.o. MRN: [id number]  The patient is here for follow up  and management of other chronic and acute problems.   The risk factors are reflected in the social history.   The roster of all physicians providing medical care to patient - is listed in the Snapshot section of the chart.   Activities of daily living:  The patient is 100% independent in all ADLs: dressing, toileting, feeding as well as independent mobility   Home safety : The patient has smoke detectors in the home. They wear seatbelts.  There are no unsecured firearms at home. There is no violence in the home.    There is no risks for hepatitis, STDs or HIV. There is no   history of blood transfusion. They have no travel history to infectious disease endemic areas of the world.   The patient has seen their dentist in the last six month. They have seen their eye doctor in the last year. The patinet  denies slight hearing difficulty with regard to whispered voices and some television programs.  They have deferred audiologic testing in the last year.  They do not  have excessive sun exposure. Discussed the need for sun protection: hats, long sleeves and use of sunscreen if there is significant sun exposure.    Diet: the importance of a healthy diet is discussed. They do have a healthy diet.   The benefits of regular aerobic exercise were discussed. The patient  exercises  3 to 5 days per week  for  60 minutes.    Depression screen: there are no signs or vegative symptoms of depression- irritability, change in appetite, anhedonia, sadness/tearfullness.   The following portions of the patient's history were reviewed and updated as appropriate: allergies, current medications, past family history, past medical history,  past surgical history, past social history  and problem list.   Visual acuity was not assessed per patient preference since the patient has regular follow up  with an  ophthalmologist. Hearing and body mass index were assessed and reviewed.    During the course of the visit the patient was educated and counseled about appropriate screening and preventive services I ncluding : fall prevention , diabetes screening, nutrition counseling, colorectal cancer screening, and recommended immunizations.    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   Neither were due to dizziness, just clumsiness; however she has become concerned she has a neurologic disorder because of her history of  a sensory neuropathy that  causes intermittent dragging of feet (right  side,  more commonly since her patellar fracture).  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  She is requesting a neurology evaluation with [facility] .  She partiipates regularly in exercise, including yoga and Pilates without difficulty   Recently diagnosed with Frontal fibrosing alopecia. Taking tacrolimus  ointment and now on 100 mg spironolactone  without dizziness  for the past month   Review of Symptoms  Patient denies headache, fevers, malaise, unintentional weight loss, skin rash, eye pain, sinus congestion and sinus pain, sore throat, dysphagia,  hemoptysis , cough, dyspnea, wheezing, chest pain, palpitations, orthopnea, edema, abdominal pain, nausea, melena, diarrhea, constipation, flank pain, dysuria, hematuria, urinary  Frequency, nocturia, numbness, tingling, seizures,  Focal weakness, Loss of consciousness,  Tremor, insomnia, depression, anxiety, and  suicidal ideation.    Physical Exam:  BP 94/60   Pulse 75   Temp 97.8 F (36.6 C)   Resp 20   Ht 5' 4.5 (1.638 m)   Wt 135 lb 2 oz (61.3 kg)   LMP 08/19/2012   SpO2 98%   BMI 22.84 kg/m    Physical Exam  Assessment and Plan: Encounter for screening mammogram for malignant neoplasm of breast -     3D  Screening Mammogram, Left and Right; Future  Acquired hypothyroidism Assessment & Plan: Thyroid  function is  normal on current dose of 75 mcg daily levothyroxine  Lab Results  Component Value Date   TSH 0.87 05/07/2024    Orders: -     TSH  Hyperlipidemia, unspecified hyperlipidemia type -     Lipid panel -     Comprehensive metabolic panel with GFR -     LDL cholesterol, direct  Impaired fasting glucose -     Hemoglobin A1c  Other fatigue  Anemia, unspecified type -     CBC with Differential/Platelet -     Iron, TIBC and Ferritin Panel  Multiple actinic keratoses Assessment & Plan: Managed by dermatology with topical 5FU   Frontal fibrosing alopecia Assessment & Plan: Diagnosed in April.  Dermatology has added spironolactone  empirically , currently at the 100 mg dose and tolerating it    Sensory neuropathy Assessment & Plan: Diagnosed  in 2007 ,  records unavailable. Recent falls raising concern for progression vs concurrent diagnosis of MS or other progressive disorder.  Last brain MRI was done in 2018 after a vestibular migraine caused brain fog and thickended speech which resolved with prednisone .   Referring to [facility] for workup    Cerebrovascular small vessel disease Assessment & Plan: Noted on 2018 brain MRI. She has no history of hyperlipidemia, hypertension or diabetes   Lab Results  Component Value Date   CHOL 180 05/07/2024   HDL 60 05/07/2024   LDLCALC 100 (H) 05/07/2024   TRIG 105 05/07/2024   CHOLHDL 3.0 05/07/2024   Lab Results  Component Value Date   HGBA1C 6.3 (H) 05/07/2024      Other orders -     Levothyroxine  Sodium; Take 1 tablet (75 mcg total) by mouth daily before breakfast.  Dispense: 90 tablet; Refill: 2 -     Spironolactone ; Take 1 tablet (100 mg total) by mouth daily.  Dispense: 90 tablet; Refill: 3 -     Alendronate  Sodium; Take 1 tablet (70 mg total) by mouth every 7 (seven) days. Take with a full glass of  water on an empty stomach.  Dispense: 12 tablet; Refill: 3 -     Estradiol ; Place 1 Applicatorful vaginally at bedtime. FOR 14 DAYS, then twice weekly thereafter  Dispense: 42.5 g; Refill: 12  A total of 40 minutes was spent with patient more than half of which was spent in counseling patient on the above mentioned issues , reviewing prior notes from dermatolgy and neurology,  reviewed  recent labs and imaging studies done, and coordination of care.   No follow-ups on file.  Kristina LITTIE Kettering, MD

## 2024-05-08 DIAGNOSIS — L570 Actinic keratosis: Secondary | ICD-10-CM | POA: Insufficient documentation

## 2024-05-08 LAB — CBC WITH DIFFERENTIAL/PLATELET
Absolute Lymphocytes: 1747 {cells}/uL (ref 850–3900)
Absolute Monocytes: 589 {cells}/uL (ref 200–950)
Basophils Absolute: 70 {cells}/uL (ref 0–200)
Basophils Relative: 1.1 %
Eosinophils Absolute: 461 {cells}/uL (ref 15–500)
Eosinophils Relative: 7.2 %
HCT: 40.6 % (ref 35.0–45.0)
Hemoglobin: 13.2 g/dL (ref 11.7–15.5)
MCH: 29.2 pg (ref 27.0–33.0)
MCHC: 32.5 g/dL (ref 32.0–36.0)
MCV: 89.8 fL (ref 80.0–100.0)
MPV: 10.1 fL (ref 7.5–12.5)
Monocytes Relative: 9.2 %
Neutro Abs: 3533 {cells}/uL (ref 1500–7800)
Neutrophils Relative %: 55.2 %
Platelets: 295 Thousand/uL (ref 140–400)
RBC: 4.52 Million/uL (ref 3.80–5.10)
RDW: 15.5 % — ABNORMAL HIGH (ref 11.0–15.0)
Total Lymphocyte: 27.3 %
WBC: 6.4 Thousand/uL (ref 3.8–10.8)

## 2024-05-08 LAB — COMPREHENSIVE METABOLIC PANEL WITH GFR
AG Ratio: 1.7 (calc) (ref 1.0–2.5)
ALT: 16 U/L (ref 6–29)
AST: 18 U/L (ref 10–35)
Albumin: 4.5 g/dL (ref 3.6–5.1)
Alkaline phosphatase (APISO): 64 U/L (ref 37–153)
BUN: 23 mg/dL (ref 7–25)
CO2: 25 mmol/L (ref 20–32)
Calcium: 9.4 mg/dL (ref 8.6–10.4)
Chloride: 101 mmol/L (ref 98–110)
Creat: 0.79 mg/dL (ref 0.50–1.05)
Globulin: 2.6 g/dL (ref 1.9–3.7)
Glucose, Bld: 127 mg/dL — ABNORMAL HIGH (ref 65–99)
Potassium: 4.2 mmol/L (ref 3.5–5.3)
Sodium: 136 mmol/L (ref 135–146)
Total Bilirubin: 0.4 mg/dL (ref 0.2–1.2)
Total Protein: 7.1 g/dL (ref 6.1–8.1)
eGFR: 82 mL/min/1.73m2 (ref >=60)

## 2024-05-08 LAB — IRON,TIBC AND FERRITIN PANEL
%SAT: 31 % (ref 16–45)
Ferritin: 19 ng/mL (ref 16–288)
Iron: 101 ug/dL (ref 45–160)
TIBC: 330 ug/dL (ref 250–450)

## 2024-05-08 LAB — TSH: TSH: 0.87 m[IU]/L (ref 0.40–4.50)

## 2024-05-08 LAB — HEMOGLOBIN A1C
Hgb A1c MFr Bld: 6.3 % — ABNORMAL HIGH (ref <5.7)
Mean Plasma Glucose: 134 mg/dL
eAG (mmol/L): 7.4 mmol/L

## 2024-05-08 LAB — LIPID PANEL
Cholesterol: 180 mg/dL (ref <200)
HDL: 60 mg/dL (ref >=50)
LDL Cholesterol (Calc): 100 mg/dL — ABNORMAL HIGH
Non-HDL Cholesterol (Calc): 120 mg/dL (ref <130)
Total CHOL/HDL Ratio: 3 (calc) (ref <5.0)
Triglycerides: 105 mg/dL (ref <150)

## 2024-05-08 NOTE — Assessment & Plan Note (Signed)
 Managed by dermatology with topical 5FU

## 2024-05-08 NOTE — Assessment & Plan Note (Signed)
 Thyroid  function is  normal on current dose of 75 mcg daily levothyroxine  Lab Results  Component Value Date   TSH 0.87 05/07/2024

## 2024-05-08 NOTE — Assessment & Plan Note (Signed)
 Noted on 2018 brain MRI. She has no history of hyperlipidemia, hypertension or diabetes   Lab Results  Component Value Date   CHOL 180 05/07/2024   HDL 60 05/07/2024   LDLCALC 100 (H) 05/07/2024   TRIG 105 05/07/2024   CHOLHDL 3.0 05/07/2024   Lab Results  Component Value Date   HGBA1C 6.3 (H) 05/07/2024

## 2024-05-08 NOTE — Assessment & Plan Note (Signed)
 Diagnosed in April.  Dermatology has added spironolactone  empirically , currently at the 100 mg dose and tolerating it

## 2024-05-08 NOTE — Assessment & Plan Note (Signed)
 Diagnosed  in 2007 ,  records unavailable. Recent falls raising concern for progression vs concurrent diagnosis of MS or other progressive disorder.  Last brain MRI was done in 2018 after a vestibular migraine caused brain fog and thickended speech which resolved with prednisone .   Referring to [facility] for workup

## 2024-05-09 ENCOUNTER — Ambulatory Visit: Payer: Self-pay | Admitting: Internal Medicine

## 2024-05-09 LAB — LDL CHOLESTEROL, DIRECT: Direct LDL: 119 mg/dL — ABNORMAL HIGH (ref <100)

## 2024-05-10 NOTE — Telephone Encounter (Signed)
 Pt is requesting a neurology referral to Dr Tonita Blanch

## 2024-05-11 ENCOUNTER — Encounter: Payer: Self-pay | Admitting: Neurology

## 2024-05-11 NOTE — Addendum Note (Signed)
 Addended by: MARYLYNN VERNEITA CROME on: 05/11/2024 08:05 AM   Modules accepted: Orders

## 2024-06-15 ENCOUNTER — Encounter

## 2024-06-21 ENCOUNTER — Ambulatory Visit
Admission: RE | Admit: 2024-06-21 | Discharge: 2024-06-21 | Disposition: A | Discharge status: Home / Self Care | Source: Ambulatory Visit | Attending: Internal Medicine | Admitting: Internal Medicine

## 2024-06-21 DIAGNOSIS — Z1231 Encounter for screening mammogram for malignant neoplasm of breast: Secondary | ICD-10-CM | POA: Diagnosis present

## 2024-06-28 ENCOUNTER — Ambulatory Visit (INDEPENDENT_AMBULATORY_CARE_PROVIDER_SITE_OTHER): Admitting: Internal Medicine

## 2024-06-28 ENCOUNTER — Encounter: Payer: Self-pay | Admitting: Internal Medicine

## 2024-06-28 VITALS — BP 104/72 | HR 76 | Ht 64.5 in | Wt 132.0 lb

## 2024-06-28 DIAGNOSIS — Z0000 Encounter for general adult medical examination without abnormal findings: Secondary | ICD-10-CM | POA: Insufficient documentation

## 2024-06-28 DIAGNOSIS — R7303 Prediabetes: Secondary | ICD-10-CM | POA: Insufficient documentation

## 2024-06-28 DIAGNOSIS — M85852 Other specified disorders of bone density and structure, left thigh: Secondary | ICD-10-CM

## 2024-06-28 DIAGNOSIS — L6612 Frontal fibrosing alopecia: Secondary | ICD-10-CM

## 2024-06-28 MED ORDER — ALBUTEROL SULFATE HFA 108 (90 BASE) MCG/ACT IN AERS: INHALATION_SPRAY | RESPIRATORY_TRACT | 0 refills | Status: AC

## 2024-06-28 NOTE — Assessment & Plan Note (Signed)

## 2024-06-28 NOTE — Assessment & Plan Note (Signed)
 Diagnosed in April.  Dermatology has added spironolactone  empirically , currently at the 100 mg dose and tolerating it

## 2024-06-28 NOTE — Assessment & Plan Note (Signed)
 She attributes the dramatic rise in A1c to multiple [place] trips over the alst several months and increased indulgence in chocolate.  Will repeat after the holidays   Lab Results  Component Value Date   HGBA1C 6.3 (H) 05/07/2024

## 2024-06-28 NOTE — Progress Notes (Signed)
 Subjective:    Kristina Barnes is a 68 y.o. female who presents for a Welcome to Medicare exam.          Objective:    Today's Vitals   06/28/24 1325  BP: 104/72  Pulse: 76  SpO2: 94%  Weight: 132 lb (59.9 kg)  Height: 5' 4.5 (1.638 m)  Body mass index is 22.31 kg/m.  Medications Outpatient Encounter Medications as of 06/28/2024  Medication Sig   alendronate  (FOSAMAX ) 70 MG tablet Take 1 tablet (70 mg total) by mouth every 7 (seven) days. Take with a full glass of water on an empty stomach.   Ascorbic Acid (VITAMIN C) 100 MG tablet Take 100 mg by mouth daily.   estradiol  (ESTRACE ) 0.1 MG/GM vaginal cream Place 1 Applicatorful vaginally at bedtime. FOR 14 DAYS, then twice weekly thereafter   fluticasone  (FLONASE ) 50 MCG/ACT nasal spray Place 2 sprays into both nostrils daily.   levothyroxine  (SYNTHROID ) 75 MCG tablet Take 1 tablet (75 mcg total) by mouth daily before breakfast.   loratadine  (CLARITIN ) 10 MG tablet Take 1 tablet (10 mg total) by mouth at bedtime.   Magnesium 400 MG TABS Take 400 mg by mouth daily.   Multiple Vitamin (MULTIVITAMIN) tablet Take 1 tablet by mouth daily.   spironolactone  (ALDACTONE ) 100 MG tablet Take 1 tablet (100 mg total) by mouth daily.   tacrolimus  (PROTOPIC ) 0.1 % ointment Apply topically 2 (two) times daily. Apply to affected areas on scalp after clobetasol    [DISCONTINUED] albuterol  (VENTOLIN  HFA) 108 (90 Base) MCG/ACT inhaler 2 puff as directed every four to six hours as needed   albuterol  (VENTOLIN  HFA) 108 (90 Base) MCG/ACT inhaler 2 puff as directed every four to six hours as needed   pimecrolimus  (ELIDEL ) 1 % cream Apply 1 Application topically 2 (two) times daily. (Patient not taking: Reported on 06/28/2024)   No facility-administered encounter medications on file as of 06/28/2024.     History: Past Medical History:  Diagnosis Date   Allergy    Arthritis Right index finger   Asthma    Basal cell carcinoma 2015   L  infraorbital   Cataract    Hypothyroidism    Menopause syndrome    Migraine 1981   Neuromuscular disorder (HCC)    Sensory neuropathy    Skin cancer 2015   basal cell at left infraorbital   Thyroid  disease    Past Surgical History:  Procedure Laterality Date   APPENDECTOMY     COLON SURGERY  Polypectomy 2018   COLONOSCOPY WITH PROPOFOL  N/A 03/25/2017   Procedure: COLONOSCOPY WITH PROPOFOL ;  Surgeon: Jinny Carmine, MD;  Location: [facility] ENDOSCOPY;  Service: Endoscopy;  Laterality: N/A;   COLONOSCOPY WITH PROPOFOL  N/A 04/23/2022   Procedure: COLONOSCOPY WITH PROPOFOL ;  Surgeon: Jinny Carmine, MD;  Location: [facility] ENDOSCOPY;  Service: Endoscopy;  Laterality: N/A;    Family History  Problem Relation Age of Onset   Arthritis Mother        rheumatoid   Hypertension Mother    Ovarian cancer Mother 50   Osteoporosis Mother    Cancer Mother    Diabetes Mother    Kidney disease Mother    Varicose Veins Mother    Cancer Father    Depression Sister    Pulmonary embolism Sister    Alcohol abuse Sister    Alcohol abuse Brother    Breast cancer Neg Hx    Social History   Occupational History   Occupation: [employer]  Tobacco  Use   Smoking status: Never   Smokeless tobacco: Never  Vaping Use   Vaping status: Not on file  Substance and Sexual Activity   Alcohol use: Yes    Alcohol/week: 5.0 standard drinks of alcohol    Types: 5 Standard drinks or equivalent per week    Comment: Fri/Sat night (7 drinks per week)   Drug use: No   Sexual activity: Yes    Birth control/protection: Post-menopausal    Tobacco Counseling Not needed.  Patient does not use tobacco products  Exercise Patient exercises 5 days per week ,  60 minutes , using a variety of activities including Pilates,  yoga and swimming   Immunizations and Health Maintenance Immunization History  Administered Date(s) Administered   Hepatitis A 08/02/2012, 02/05/2013   Hepatitis B 02/05/1997   IPV 02/05/1985    Influenza Split 08/02/2013, 08/05/2015   Influenza-Unspecified 09/06/2003, 08/28/2004, 08/22/2006, 08/03/2008, 08/16/2009, 08/05/2015, 07/23/2019, 07/14/2020, 08/07/2021   MMR 07/10/2018   Moderna Covid-19 Vaccine Bivalent Booster 25yrs & up 09/14/2021   PFIZER Comirnaty (Gray Top)Covid-19 Tri-Sucrose Vaccine 03/21/2021   PFIZER(Purple Top)SARS-COV-2 Vaccination 10/05/2019, 10/26/2019, 08/11/2020   PNEUMOCOCCAL CONJUGATE-20 05/01/2022   PPD Test 09/27/2015, 10/17/2015   Pfizer(Comirnaty )Fall Seasonal Vaccine 12 years and older 10/02/2022, 08/05/2023   Td 03/27/2017   Td (Adult),unspecified 10/14/1996   Tdap 01/06/2006, 02/06/2007   Zoster, Live 08/02/2012   Health Maintenance Due  Topic Date Due   Zoster Vaccines- Shingrix (1 of 2) 02/19/1976   Influenza Vaccine  05/14/2024   COVID-19 Vaccine (8 - Pfizer risk 2024-25 season) 06/14/2024    Activities of Daily Living    06/28/2024    1:42 PM 06/28/2024    1:28 PM  In your present state of health, do you have any difficulty performing the following activities:  Hearing? 0 0  Vision? 0 0  Difficulty concentrating or making decisions? 0 0  Walking or climbing stairs? 0 0  Dressing or bathing? 0 0  Doing errands, shopping? 0 0  Preparing Food and eating ? N N  Using the Toilet? N N  In the past six months, have you accidently leaked urine? N N  Do you have problems with loss of bowel control? N N  Managing your Medications? N N  Managing your Finances? N N  Housekeeping or managing your Housekeeping? N N    Physical Exam   Physical Exam Vitals reviewed.  Constitutional:      General: She is not in acute distress.    Appearance: Normal appearance. She is normal weight. She is not ill-appearing, toxic-appearing or diaphoretic.  HENT:     Head: Normocephalic.  Eyes:     General: No scleral icterus.       Right eye: No discharge.        Left eye: No discharge.     Conjunctiva/sclera: Conjunctivae normal.  Cardiovascular:      Rate and Rhythm: Normal rate and regular rhythm.     Heart sounds: Normal heart sounds.  Pulmonary:     Effort: Pulmonary effort is normal. No respiratory distress.     Breath sounds: Normal breath sounds.  Musculoskeletal:        General: Normal range of motion.  Skin:    General: Skin is warm and dry.  Neurological:     General: No focal deficit present.     Mental Status: She is alert and oriented to person, place, and time. Mental status is at baseline.  Psychiatric:  Mood and Affect: Mood normal.        Behavior: Behavior normal.        Thought Content: Thought content normal.        Judgment: Judgment normal.    (optional), or other factors deemed appropriate based on the beneficiary's medical and social history and current clinical standards.   Advanced Directives: Does Patient Have a Medical Advance Directive?: Yes Type of Advance Directive: Healthcare Power of Attorney, Living will Does patient want to make changes to medical advance directive?: No - Patient declined Copy of Healthcare Power of Attorney in Chart?: No - copy requested       Assessment:    This is a routine wellness examination for this patient .   Vision/Hearing screen Vision Screening   Right eye Left eye Both eyes  Without correction     With correction  20/40 20/40     Goals   None     Depression Screen    06/28/2024    1:32 PM 05/07/2024    1:31 PM 05/07/2023    2:41 PM 05/01/2022    9:05 AM  PHQ 2/9 Scores  PHQ - 2 Score 0 2 0 1  PHQ- 9 Score 0 2       Fall Risk    06/28/2024    1:33 PM  Fall Risk   Falls in the past year? 1  Number falls in past yr: 1  Injury with Fall? 0  Risk for fall due to : History of fall(s)  Follow up Falls evaluation completed    Cognitive Function:        06/28/2024    1:29 PM  6CIT Screen  What Year? 0 points  What month? 0 points  What time? 0 points  Count back from 20 0 points  Months in reverse 0 points  Repeat phrase 4  points  Total Score 4 points    Patient Care Team: Marylynn Verneita CROME, MD as PCP - General (Internal Medicine)     Plan:     I have personally reviewed and noted the following in the patient's chart:   Medical and social history Use of alcohol, tobacco or illicit drugs  Current medications and supplements including opioid prescriptions. Patient is not currently taking opioid prescriptions. Functional ability and status Nutritional status Physical activity Advanced directives List of other physicians Hospitalizations, surgeries, and ER visits in previous 12 months Vitals Screenings to include cognitive, depression, and falls Referrals and appointments  In addition, I have reviewed and discussed with patient certain preventive protocols, quality metrics, and best practice recommendations. A written personalized care plan for preventive services as well as general preventive health recommendations were provided to patient.     Verneita CROME Marylynn, MD 06/28/2024

## 2024-06-28 NOTE — Assessment & Plan Note (Signed)
 She has resumed alendronate  since last visit .  DEXA deferred utnil summer 2026

## 2024-07-13 ENCOUNTER — Ambulatory Visit (INDEPENDENT_AMBULATORY_CARE_PROVIDER_SITE_OTHER): Admitting: Neurology

## 2024-07-13 ENCOUNTER — Encounter: Payer: Self-pay | Admitting: Neurology

## 2024-07-13 VITALS — BP 118/76 | HR 77 | Ht 64.5 in | Wt 133.0 lb

## 2024-07-13 DIAGNOSIS — R202 Paresthesia of skin: Secondary | ICD-10-CM

## 2024-07-13 DIAGNOSIS — H532 Diplopia: Secondary | ICD-10-CM | POA: Diagnosis not present

## 2024-07-13 NOTE — Progress Notes (Signed)
 [facility] Clinic Note - Initial Visit   Date: 07/13/2024   Kristina Barnes MRN: [id number] DOB: Sep 21, 1956   Dear Dr. Marylynn:  Thank you for your kind referral of Kristina Barnes for consultation of neuropathy and double vision. Although her history is well known to you, please allow us  to reiterate it for the purpose of our medical record. The patient was accompanied to the clinic by self.     Kristina Barnes is a 68 y.o. right-handed female with migraine, hypothyroidism, and vertigo presenting for evaluation of neuropathy and diplopia.  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  I am unable to appreciate any extraocular weakness on her exam today.  Symptoms are not consistent with myasthenia gravis. I offered AChR antibody testing as well as CTA to evaluate for aneurysm and mutually decided that likelihood of myasthenia or aneurysm is very low.  She is interested in see neurophthalmology for further evaluation.   Paresthesias of the hands and feet.  Her neurological exam shows patchy loss of pin prick in the lower legs, but intact in the feet.  Vibration and temperature is normal, as is her reflexes and distal strength.  Large fiber neuropathy seems less likely.  Small fiber neuropathy could be possible.  Skin biopsy would be diagnostic.  Testing with NCS/EMG and skin biopsy was declined.    Return to clinic as needed  ------------------------------------------------------------- History of present illness: Starting 20 years ago, she began having various neurological symptoms.  She was previously evaluated at [facility] for suspected sensory peripheral neuropathy with hypersensitivity, tingling and numbness in the feet and hands.  NCS/EMG or skin biopsy was not performed.  Fine muscle fasciculations occurred daily for 4-5 years which has since resolved.  Complex  migraines with aura as well as aura without headaches.  Complicated migraine in 2020 with vertigo. Symptoms improved with steroids. She drags her right leg for the past 15 years. She had a fall in 2015 which fractured her right patella. She has had two falls in the past year.  No associated dizziness, weakness, or other warning.  Fluctuating double vision for the past 4-5 years, which became more constant over the past 2 years.  She was diagnosed with superior oblique palsy.  Prisms only help if she is looking straight ahead.    Her diplopia is the most bothersome, followed by neuropathy.    Nonsmoker.  She socially drinks alcohol.  She lives at home with husband.  She is enjoyed her retirement and spends a lot of time travelling.   Out-side paper records, electronic medical record, and images have been reviewed where available and summarized as:  Lab Results  Component Value Date   HGBA1C 6.3 (H) 05/07/2024   Lab Results  Component Value Date   VITAMINB12 457 01/05/2020   Lab Results  Component Value Date   TSH 0.87 05/07/2024   Lab Results  Component Value Date   ESRSEDRATE 3 01/05/2020    Past Medical History:  Diagnosis Date   Allergy    Arthritis Right index finger   Asthma    Basal cell carcinoma 2015   L infraorbital   Cataract    Hypothyroidism    Menopause syndrome    Migraine 1981   Neuromuscular disorder (HCC)    Sensory neuropathy    Skin cancer 2015   basal cell at left infraorbital   Thyroid  disease  Past Surgical History:  Procedure Laterality Date   APPENDECTOMY     COLON SURGERY  Polypectomy 2018   COLONOSCOPY WITH PROPOFOL  N/A 03/25/2017   Procedure: COLONOSCOPY WITH PROPOFOL ;  Surgeon: Jinny Carmine, MD;  Location: [facility] ENDOSCOPY;  Service: Endoscopy;  Laterality: N/A;   COLONOSCOPY WITH PROPOFOL  N/A 04/23/2022   Procedure: COLONOSCOPY WITH PROPOFOL ;  Surgeon: Jinny Carmine, MD;  Location: [facility] ENDOSCOPY;  Service: Endoscopy;  Laterality: N/A;      Medications:  Outpatient Encounter Medications as of 07/13/2024  Medication Sig   albuterol  (VENTOLIN  HFA) 108 (90 Base) MCG/ACT inhaler 2 puff as directed every four to six hours as needed   alendronate  (FOSAMAX ) 70 MG tablet Take 1 tablet (70 mg total) by mouth every 7 (seven) days. Take with a full glass of water on an empty stomach.   Ascorbic Acid (VITAMIN C) 100 MG tablet Take 100 mg by mouth daily. (Patient taking differently: Take 500 mg by mouth daily.)   estradiol  (ESTRACE ) 0.1 MG/GM vaginal cream Place 1 Applicatorful vaginally at bedtime. FOR 14 DAYS, then twice weekly thereafter   fluticasone  (FLONASE ) 50 MCG/ACT nasal spray Place 2 sprays into both nostrils daily.   levothyroxine  (SYNTHROID ) 75 MCG tablet Take 1 tablet (75 mcg total) by mouth daily before breakfast.   loratadine  (CLARITIN ) 10 MG tablet Take 1 tablet (10 mg total) by mouth at bedtime.   Magnesium 400 MG TABS Take 400 mg by mouth daily.   Multiple Vitamin (MULTIVITAMIN) tablet Take 1 tablet by mouth daily.   pimecrolimus  (ELIDEL ) 1 % cream Apply 1 Application topically 2 (two) times daily.   spironolactone  (ALDACTONE ) 100 MG tablet Take 1 tablet (100 mg total) by mouth daily.   tacrolimus  (PROTOPIC ) 0.1 % ointment Apply topically 2 (two) times daily. Apply to affected areas on scalp after clobetasol    No facility-administered encounter medications on file as of 07/13/2024.    Allergies:  Allergies  Allergen Reactions   Penicillins Rash   Sulfa Antibiotics Rash    Family History: Family History  Problem Relation Age of Onset   Arthritis Mother        rheumatoid   Hypertension Mother    Ovarian cancer Mother 58   Osteoporosis Mother    Cancer Mother    Diabetes Mother    Kidney disease Mother    Varicose Veins Mother    Cancer Father        Lung- heavy smoker   Depression Sister    Pulmonary embolism Sister    Alcohol abuse Sister    Alcohol abuse Brother    Breast cancer Neg Hx      Social History: Social History   Tobacco Use   Smoking status: Never   Smokeless tobacco: Never  Substance Use Topics   Alcohol use: Yes    Alcohol/week: 5.0 standard drinks of alcohol    Types: 5 Standard drinks or equivalent per week    Comment: Fri/Sat night (7 drinks per week)   Drug use: No   Social History   Social History Narrative   Lives with husband   Caffeine use: Coffee daily   Right handed       Are you right handed or left handed? Right Handed    Are you currently employed ? No    What is your current occupation? Retired    Do you live at home alone? No    Who lives with you? Husband    What type of home do you live  in: 1 story or 2 story? Three story home.        Vital Signs:  BP 118/76   Pulse 77   Ht 5' 4.5 (1.638 m)   Wt 133 lb (60.3 kg)   LMP 08/19/2012   SpO2 98%   BMI 22.48 kg/m    Neurological Exam: MENTAL STATUS including orientation to time, place, person, recent and remote memory, attention span and concentration, language, and fund of knowledge is normal.  Speech is not dysarthric.  CRANIAL NERVES: II:  No visual field defects.     III-IV-VI: Pupils equal round and reactive to light.  Normal conjugate, extra-ocular eye movements in all directions of gaze.  No nystagmus.  No ptosis.   V:  Normal facial sensation.    VII:  Normal facial symmetry and movements.   VIII:  Normal hearing and vestibular function.   IX-X:  Normal palatal movement.   XI:  Normal shoulder shrug and head rotation.   XII:  Normal tongue strength and range of motion, no deviation or fasciculation.  MOTOR:  No atrophy, fasciculations or abnormal movements.  No pronator drift.   Upper Extremity:  Right  Left  Deltoid  5/5   5/5   Biceps  5/5   5/5   Triceps  5/5   5/5   Wrist extensors  5/5   5/5   Wrist flexors  5/5   5/5   Finger extensors  5/5   5/5   Finger flexors  5/5   5/5   Dorsal interossei  5/5   5/5   Tone (Ashworth scale)  0  0   Lower  Extremity:  Right  Left  Hip flexors  5/5   5/5   Knee flexors  5/5   5/5   Knee extensors  5/5   5/5   Dorsiflexors  5/5   5/5   Plantarflexors  5/5   5/5   Toe extensors  5/5   5/5   Toe flexors  5/5   5/5   Tone (Ashworth scale)  0  0   MSRs:                                           Right        Left brachioradialis 2+  2+  biceps 2+  2+  triceps 2+  2+  patellar 2+  2+  ankle jerk 2+  2+  plantar response down  down   SENSORY:  Patch loss of pin prick in the lower legs, intact in the feet.  Vibration and temperature is intact throughout.  Romberg's sign absent.   COORDINATION/GAIT: Normal finger-to- nose-finger.  Intact rapid alternating movements bilaterally.  Gait narrow based and stable. Tandem and stressed gait intact.    Thank you for allowing me to participate in patient's care.  If I can answer any additional questions, I would be pleased to do so.    Sincerely,    Donovan Persley K. Tobie, DO

## 2024-08-02 ENCOUNTER — Encounter: Payer: Self-pay | Admitting: Neurology

## 2024-08-03 NOTE — Telephone Encounter (Signed)
 Referral refaxed. Will follow up to ensure they have received referral.

## 2024-10-06 ENCOUNTER — Other Ambulatory Visit (INDEPENDENT_AMBULATORY_CARE_PROVIDER_SITE_OTHER)

## 2024-10-06 DIAGNOSIS — R7303 Prediabetes: Secondary | ICD-10-CM

## 2024-10-06 LAB — COMPREHENSIVE METABOLIC PANEL WITH GFR
ALT: 14 U/L (ref 3–35)
AST: 21 U/L (ref 5–37)
Albumin: 4.5 g/dL (ref 3.5–5.2)
Alkaline Phosphatase: 46 U/L (ref 39–117)
BUN: 14 mg/dL (ref 6–23)
CO2: 27 meq/L (ref 19–32)
Calcium: 9.3 mg/dL (ref 8.4–10.5)
Chloride: 101 meq/L (ref 96–112)
Creatinine, Ser: 0.75 mg/dL (ref 0.40–1.20)
GFR: 82.26 mL/min
Glucose, Bld: 95 mg/dL (ref 70–99)
Potassium: 5 meq/L (ref 3.5–5.1)
Sodium: 137 meq/L (ref 135–145)
Total Bilirubin: 0.6 mg/dL (ref 0.2–1.2)
Total Protein: 6.5 g/dL (ref 6.0–8.3)

## 2024-10-06 LAB — HEMOGLOBIN A1C: Hgb A1c MFr Bld: 5.5 % (ref 4.6–6.5)

## 2024-10-10 ENCOUNTER — Ambulatory Visit: Payer: Self-pay | Admitting: Internal Medicine

## 2024-10-19 ENCOUNTER — Encounter: Payer: Self-pay | Admitting: Internal Medicine

## 2024-10-20 ENCOUNTER — Other Ambulatory Visit: Payer: Self-pay | Admitting: Internal Medicine

## 2024-10-20 MED ORDER — DOXYCYCLINE HYCLATE 100 MG PO TABS: 100.0000 mg | ORAL_TABLET | Freq: Two times a day (BID) | ORAL | 0 refills | Status: AC

## 2024-10-31 ENCOUNTER — Other Ambulatory Visit: Payer: Self-pay | Admitting: Internal Medicine

## 2024-11-04 NOTE — Telephone Encounter (Signed)
 Noted.

## 2025-02-03 ENCOUNTER — Ambulatory Visit: Admitting: Dermatology
